# Patient Record
Sex: Male | Born: 1970 | Race: White | Hispanic: Yes | Marital: Married | Smoking: Former smoker
Health system: Southern US, Community
[De-identification: ages and names within clinical notes are randomized; demographics above are authoritative.]

## PROBLEM LIST (undated history)

## (undated) DIAGNOSIS — K769 Liver disease, unspecified: Secondary | ICD-10-CM

## (undated) DIAGNOSIS — K7460 Unspecified cirrhosis of liver: Secondary | ICD-10-CM

## (undated) HISTORY — DX: Unspecified cirrhosis of liver: K74.60

## (undated) HISTORY — DX: Liver disease, unspecified: K76.9

## (undated) HISTORY — PX: NO PAST SURGERIES: SHX2092

---

## 2010-02-04 ENCOUNTER — Emergency Department (HOSPITAL_COMMUNITY): Admission: EM | Admit: 2010-02-04 | Discharge: 2010-02-05 | Payer: Self-pay | Admitting: Emergency Medicine

## 2010-07-29 LAB — URINALYSIS, ROUTINE W REFLEX MICROSCOPIC
Bilirubin Urine: NEGATIVE
Glucose, UA: NEGATIVE mg/dL
Nitrite: NEGATIVE
Specific Gravity, Urine: 1.029 (ref 1.005–1.030)
pH: 6 (ref 5.0–8.0)

## 2010-07-29 LAB — URINE MICROSCOPIC-ADD ON

## 2012-10-15 ENCOUNTER — Emergency Department (HOSPITAL_COMMUNITY): Payer: Self-pay

## 2012-10-15 ENCOUNTER — Encounter (HOSPITAL_COMMUNITY): Payer: Self-pay | Admitting: *Deleted

## 2012-10-15 ENCOUNTER — Emergency Department (HOSPITAL_COMMUNITY)
Admission: EM | Admit: 2012-10-15 | Discharge: 2012-10-15 | Disposition: A | Discharge status: Home / Self Care | Payer: Self-pay | Attending: Emergency Medicine | Admitting: Emergency Medicine

## 2012-10-15 DIAGNOSIS — F17200 Nicotine dependence, unspecified, uncomplicated: Secondary | ICD-10-CM | POA: Insufficient documentation

## 2012-10-15 DIAGNOSIS — M79609 Pain in unspecified limb: Secondary | ICD-10-CM

## 2012-10-15 DIAGNOSIS — M79605 Pain in left leg: Secondary | ICD-10-CM

## 2012-10-15 MED ORDER — IBUPROFEN 600 MG PO TABS: 600.0000 mg | ORAL_TABLET | Freq: Three times a day (TID) | ORAL | Status: DC | PRN

## 2012-10-15 MED ORDER — OXYCODONE-ACETAMINOPHEN 5-325 MG PO TABS
1.0000 | ORAL_TABLET | Freq: Once | ORAL | Status: AC
  Administered 2012-10-15: 1 via ORAL
  Filled 2012-10-15: qty 1

## 2012-10-15 MED ORDER — HYDROCODONE-ACETAMINOPHEN 5-325 MG PO TABS: 1.0000 | ORAL_TABLET | ORAL | Status: DC | PRN

## 2012-10-15 NOTE — Progress Notes (Signed)
VASCULAR LAB PRELIMINARY  PRELIMINARY  PRELIMINARY  PRELIMINARY  Left lower extremity venous duplex completed.    Preliminary report:  Left:  No evidence of DVT, superficial thrombosis, or Baker's cyst.  Shanayah Kaffenberger, RVS 10/15/2012, 5:46 PM

## 2012-10-15 NOTE — ED Provider Notes (Signed)
History     CSN: [id number]  Arrival date & time 10/15/12  1629   First MD Initiated Contact with Patient 10/15/12 1844      Chief Complaint  Patient presents with  . Leg Pain     HPI Patient reports ongoing left lower leg pain over the past several weeks.  He reports his left medial distal shin hurts when he does squats and when he runs.  He denies numbness or weakness or tingling in his left foot.  No prior history of DVT.  No fevers or chills.  Her development of redness.  No recent fall or trauma or injury to the area.  His pain is mild to moderate in severity.  His pain is worse with ambulation and as above.  History reviewed. No pertinent past medical history.  History reviewed. No pertinent past surgical history.  No family history on file.  History  Substance Use Topics  . Smoking status: Current Every Day Smoker  . Smokeless tobacco: Not on file  . Alcohol Use: No      Review of Systems  All other systems reviewed and are negative.    Allergies  Review of patient's allergies indicates no known allergies.  Home Medications   Current Outpatient Rx  Name  Route  Sig  Dispense  Refill  . Ibuprofen (IBU PO)   Oral   Take 2 tablets by mouth every 6 (six) hours as needed (pain).         Marland Kitchen HYDROcodone-acetaminophen (NORCO/VICODIN) 5-325 MG per tablet   Oral   Take 1 tablet by mouth every 4 (four) hours as needed for pain.   15 tablet   0   . ibuprofen (ADVIL,MOTRIN) 600 MG tablet   Oral   Take 1 tablet (600 mg total) by mouth every 8 (eight) hours as needed for pain.   15 tablet   0     BP 120/73  Pulse 82  Temp(Src) 98.4 F (36.9 C) (Oral)  Resp 18  SpO2 100%  Physical Exam  Nursing note and vitals reviewed. Constitutional: He is oriented to person, place, and time. He appears well-developed and well-nourished.  HENT:  Head: Normocephalic.  Eyes: EOM are normal.  Neck: Normal range of motion.  Pulmonary/Chest: Effort normal.  Abdominal:  He exhibits no distension.  Musculoskeletal: Normal range of motion.  Mild tenderness of his left distal anteromedial tibia.  There is no obvious erythema warmth or fluctuance.  Normal PT and DP pulses in his left foot.  Full range of motion of his left ankle in his left knee.  No unilateral leg swelling of his left as compared to his right.  Full flexion and extension in his left foot without difficulty and with good strength.  Neurological: He is alert and oriented to person, place, and time.  Psychiatric: He has a normal mood and affect.    ED Course  Procedures (including critical care time)  Labs Reviewed - No data to display Dg Tibia/fibula Left  10/15/2012   *RADIOLOGY REPORT*  Clinical Data: Left tibia and fibula pain.  LEFT TIBIA AND FIBULA - 2 VIEW  Comparison: None.  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  The left tibia and fibula appear normal.  No fracture. Soft tissues appear normal.  IMPRESSION: No acute osseous abnormality.   Original Report Authenticated By: Dereck Ligas, M.D.   I personally reviewed the imaging tests through PACS system I reviewed available ER/hospitalization records through the EMR   1.  Leg pain, medial, left       MDM  Left medial lower extremity pain.  No signs of infection.  Normal pulses in his left foot.  Duplex negative for DVT.  Some of his baby more muscle strain in nature.        Hoy Morn, MD 10/15/12 [phone]

## 2012-10-15 NOTE — ED Notes (Signed)
Pt is here with complaint of left medial lower leg pain for about two weeks and hurts worse with ambulation.  Pulse present.  No injury

## 2018-01-29 ENCOUNTER — Encounter (HOSPITAL_COMMUNITY): Payer: Self-pay | Admitting: Emergency Medicine

## 2018-01-29 ENCOUNTER — Emergency Department (HOSPITAL_COMMUNITY): Payer: Self-pay

## 2018-01-29 ENCOUNTER — Emergency Department (HOSPITAL_COMMUNITY)
Admission: EM | Admit: 2018-01-29 | Discharge: 2018-01-30 | Disposition: A | Discharge status: Home / Self Care | Payer: Self-pay | Attending: Emergency Medicine | Admitting: Emergency Medicine

## 2018-01-29 ENCOUNTER — Other Ambulatory Visit: Payer: Self-pay

## 2018-01-29 DIAGNOSIS — R079 Chest pain, unspecified: Secondary | ICD-10-CM | POA: Insufficient documentation

## 2018-01-29 DIAGNOSIS — Z5321 Procedure and treatment not carried out due to patient leaving prior to being seen by health care provider: Secondary | ICD-10-CM | POA: Insufficient documentation

## 2018-01-29 LAB — CBC
HCT: 45.4 % (ref 39.0–52.0)
HEMOGLOBIN: 15.5 g/dL (ref 13.0–17.0)
MCH: 36.1 pg — ABNORMAL HIGH (ref 26.0–34.0)
MCHC: 34.1 g/dL (ref 30.0–36.0)
MCV: 105.8 fL — ABNORMAL HIGH (ref 78.0–100.0)
PLATELETS: 83 10*3/uL — AB (ref 150–400)
RBC: 4.29 MIL/uL (ref 4.22–5.81)
RDW: 13.4 % (ref 11.5–15.5)
WBC: 7.7 10*3/uL (ref 4.0–10.5)

## 2018-01-29 LAB — I-STAT TROPONIN, ED: TROPONIN I, POC: 0 ng/mL (ref 0.00–0.08)

## 2018-01-29 LAB — BASIC METABOLIC PANEL
ANION GAP: 10 (ref 5–15)
BUN: <5 mg/dL — ABNORMAL LOW (ref 6–20)
CALCIUM: 9.1 mg/dL (ref 8.9–10.3)
CO2: 25 mmol/L (ref 22–32)
CREATININE: 0.76 mg/dL (ref 0.61–1.24)
Chloride: 101 mmol/L (ref 98–111)
GLUCOSE: 130 mg/dL — AB (ref 70–99)
Potassium: 3.8 mmol/L (ref 3.5–5.1)
Sodium: 136 mmol/L (ref 135–145)

## 2018-01-29 NOTE — ED Triage Notes (Signed)
Pt to ER for evaluation of left chest pain radiating to left arm x2 weeks, patient states it worried him because he had pain radiating into his left jaw. Pt is former smoking, cessation 18 months ago, denies other medical hx. Reports pain is pressure. Pt reports shortness of breath and generalized weakness/fatigue.

## 2018-01-29 NOTE — ED Notes (Signed)
Pt. Stated they could not stay any longer.  Pt. Requested arm band cut off, so they could leave.

## 2019-11-24 ENCOUNTER — Emergency Department (HOSPITAL_COMMUNITY)
Admission: EM | Admit: 2019-11-24 | Discharge: 2019-11-24 | Disposition: A | Discharge status: Home / Self Care | Payer: Self-pay | Attending: Emergency Medicine | Admitting: Emergency Medicine

## 2019-11-24 ENCOUNTER — Other Ambulatory Visit: Payer: Self-pay

## 2019-11-24 ENCOUNTER — Emergency Department (HOSPITAL_COMMUNITY): Payer: Self-pay

## 2019-11-24 DIAGNOSIS — R079 Chest pain, unspecified: Secondary | ICD-10-CM | POA: Insufficient documentation

## 2019-11-24 DIAGNOSIS — M79602 Pain in left arm: Secondary | ICD-10-CM | POA: Insufficient documentation

## 2019-11-24 DIAGNOSIS — Z87891 Personal history of nicotine dependence: Secondary | ICD-10-CM | POA: Insufficient documentation

## 2019-11-24 LAB — CBC
HCT: 47.4 % (ref 39.0–52.0)
Hemoglobin: 16.5 g/dL (ref 13.0–17.0)
MCH: 34 pg (ref 26.0–34.0)
MCHC: 34.8 g/dL (ref 30.0–36.0)
MCV: 97.5 fL (ref 80.0–100.0)
Platelets: 105 10*3/uL — ABNORMAL LOW (ref 150–400)
RBC: 4.86 MIL/uL (ref 4.22–5.81)
RDW: 13.8 % (ref 11.5–15.5)
WBC: 4.6 10*3/uL (ref 4.0–10.5)
nRBC: 0 % (ref 0.0–0.2)

## 2019-11-24 LAB — BASIC METABOLIC PANEL
Anion gap: 12 (ref 5–15)
BUN: 7 mg/dL (ref 6–20)
CO2: 24 mmol/L (ref 22–32)
Calcium: 9.2 mg/dL (ref 8.9–10.3)
Chloride: 104 mmol/L (ref 98–111)
Creatinine, Ser: 0.77 mg/dL (ref 0.61–1.24)
GFR calc Af Amer: >60 mL/min (ref >60)
GFR calc non Af Amer: >60 mL/min (ref >60)
Glucose, Bld: 125 mg/dL — ABNORMAL HIGH (ref 70–99)
Potassium: 3.7 mmol/L (ref 3.5–5.1)
Sodium: 140 mmol/L (ref 135–145)

## 2019-11-24 LAB — TROPONIN I (HIGH SENSITIVITY)
Troponin I (High Sensitivity): 5 ng/L (ref <18)
Troponin I (High Sensitivity): 4 ng/L (ref <18)

## 2019-11-24 MED ORDER — SODIUM CHLORIDE 0.9% FLUSH: 3.0000 mL | Freq: Once | INTRAVENOUS | Status: DC

## 2019-11-24 NOTE — ED Triage Notes (Signed)
Patient accompanied by wife. Wife states patient has chest pain starting 2 days ago. 10/10. Wife states they have been working in hot temperatures and patient has not stayed hydrated. She also reports the pain is traveling down patients left arm.

## 2019-12-01 NOTE — ED Provider Notes (Signed)
[facility] DEPT Provider Note   CSN: [id number] Arrival date & time: 11/24/19  1027     History Chief Complaint  Patient presents with  . Chest Pain    Daniel Harmon is a 49 y.o. male.  HPI   49 year old male with chest pain.  Onset 2 days ago.  Pain is pretty constant.  Has not noticed any appreciable exacerbating leaving factors.  Sometimes some pain in his left arm.  No acute respiratory complaints.  No fevers or chills.  No unusual leg pain or swelling.  No past medical history on file.  There are no problems to display for this patient.  No past surgical history on file.    No family history on file.  Social History   Tobacco Use  . Smoking status: Former Smoker    Quit date: 07/29/2016    Years since quitting: 3.3  . Smokeless tobacco: Never Used  Substance Use Topics  . Alcohol use: No  . Drug use: No    Home Medications Prior to Admission medications   Medication Sig Start Date End Date Taking? Authorizing Provider  acetaminophen (TYLENOL) 500 MG tablet Take 500 mg by mouth every 6 (six) hours as needed for headache.   Yes [provider]  HYDROcodone-acetaminophen (NORCO/VICODIN) 5-325 MG per tablet Take 1 tablet by mouth every 4 (four) hours as needed for pain. Patient not taking: Reported on 11/24/2019 10/15/12   Jola Schmidt, MD  ibuprofen (ADVIL,MOTRIN) 600 MG tablet Take 1 tablet (600 mg total) by mouth every 8 (eight) hours as needed for pain. Patient not taking: Reported on 11/24/2019 10/15/12   Jola Schmidt, MD  Ibuprofen (IBU PO) Take 2 tablets by mouth every 6 (six) hours as needed (pain). Patient not taking: Reported on 11/24/2019    [provider]    Allergies    Patient has no known allergies.  Review of Systems   Review of Systems All systems reviewed and negative, other than as noted in HPI.  Physical Exam Updated Vital Signs BP (!) 144/82   Pulse 72   Temp 98.2 F (36.8 C) (Oral)    Resp 20   Ht 5' 6.14" (1.68 m)   Wt 99.8 kg   SpO2 92%   BMI 35.36 kg/m   Physical Exam Vitals and nursing note reviewed.  Constitutional:      General: He is not in acute distress.    Appearance: He is well-developed.  HENT:     Head: Normocephalic and atraumatic.  Eyes:     General:        Right eye: No discharge.        Left eye: No discharge.     Conjunctiva/sclera: Conjunctivae normal.  Cardiovascular:     Rate and Rhythm: Normal rate and regular rhythm.     Heart sounds: Normal heart sounds. No murmur heard.  No friction rub. No gallop.   Pulmonary:     Effort: Pulmonary effort is normal. No respiratory distress.     Breath sounds: Normal breath sounds.  Abdominal:     General: There is no distension.     Palpations: Abdomen is soft.     Tenderness: There is no abdominal tenderness.  Musculoskeletal:        General: No tenderness.     Cervical back: Neck supple.     Comments: Lower extremities symmetric as compared to each other. No calf tenderness. Negative Homan's. No palpable cords.   Skin:  General: Skin is warm and dry.  Neurological:     Mental Status: He is alert.  Psychiatric:        Behavior: Behavior normal.        Thought Content: Thought content normal.     ED Results / Procedures / Treatments   Labs (all labs ordered are listed, but only abnormal results are displayed) Labs Reviewed  BASIC METABOLIC PANEL - Abnormal; Notable for the following components:      Result Value   Glucose, Bld 125 (*)    All other components within normal limits  CBC - Abnormal; Notable for the following components:   Platelets 105 (*)    All other components within normal limits  TROPONIN I (HIGH SENSITIVITY)  TROPONIN I (HIGH SENSITIVITY)    EKG EKG Interpretation  Date/Time:  Sunday November 24 2019 11:37:56 EDT Ventricular Rate:  88 PR Interval:    QRS Duration: 114 QT Interval:  376 QTC Calculation: 455 R Axis:   61 Text Interpretation: Sinus rhythm  Incomplete right bundle branch block ST elev, probable normal early repol pattern No acute changes Confirmed by Mesner, Jason ([id number]) on 11/25/2019 11:44:52 AM   Radiology No results found.  Procedures Procedures (including critical care time)  Medications Ordered in ED Medications - No data to display  ED Course  I have reviewed the triage vital signs and the nursing notes.  Pertinent labs & imaging results that were available during my care of the patient were reviewed by me and considered in my medical decision making (see chart for details).    MDM Rules/Calculators/A&P                          49  year old male with chest pain.  Atypical for ACS.  Doubt PE, dissection or other emergent process. It has been determined that no acute conditions requiring further emergency intervention are present at this time. The patient has been advised of the diagnosis and plan. I reviewed any labs and imaging including any potential incidental findings. I have reviewed nursing notes and appropriate previous records. We have discussed signs and symptoms that warrant return to the ED and they are listed in the discharge instructions.      Final Clinical Impression(s) / ED Diagnoses Final diagnoses:  Chest pain, unspecified type    Rx / DC Orders ED Discharge Orders    None       Virgel Manifold, MD 12/01/19 1235

## 2020-06-24 DIAGNOSIS — R6881 Early satiety: Secondary | ICD-10-CM

## 2020-06-24 DIAGNOSIS — K7030 Alcoholic cirrhosis of liver without ascites: Secondary | ICD-10-CM

## 2020-06-24 DIAGNOSIS — F1020 Alcohol dependence, uncomplicated: Secondary | ICD-10-CM

## 2020-06-24 DIAGNOSIS — R630 Anorexia: Secondary | ICD-10-CM | POA: Insufficient documentation

## 2020-06-24 DIAGNOSIS — R160 Hepatomegaly, not elsewhere classified: Secondary | ICD-10-CM | POA: Insufficient documentation

## 2020-06-29 ENCOUNTER — Ambulatory Visit: Payer: Self-pay | Admitting: Physician Assistant

## 2021-12-19 IMAGING — CR DG CHEST 2V
2 series · 2 of 2 positions shown · non-contrast
Comparison: January 29, 2018

CLINICAL DATA: Chest pain

EXAM:
CHEST - 2 VIEW

[w chest pa]
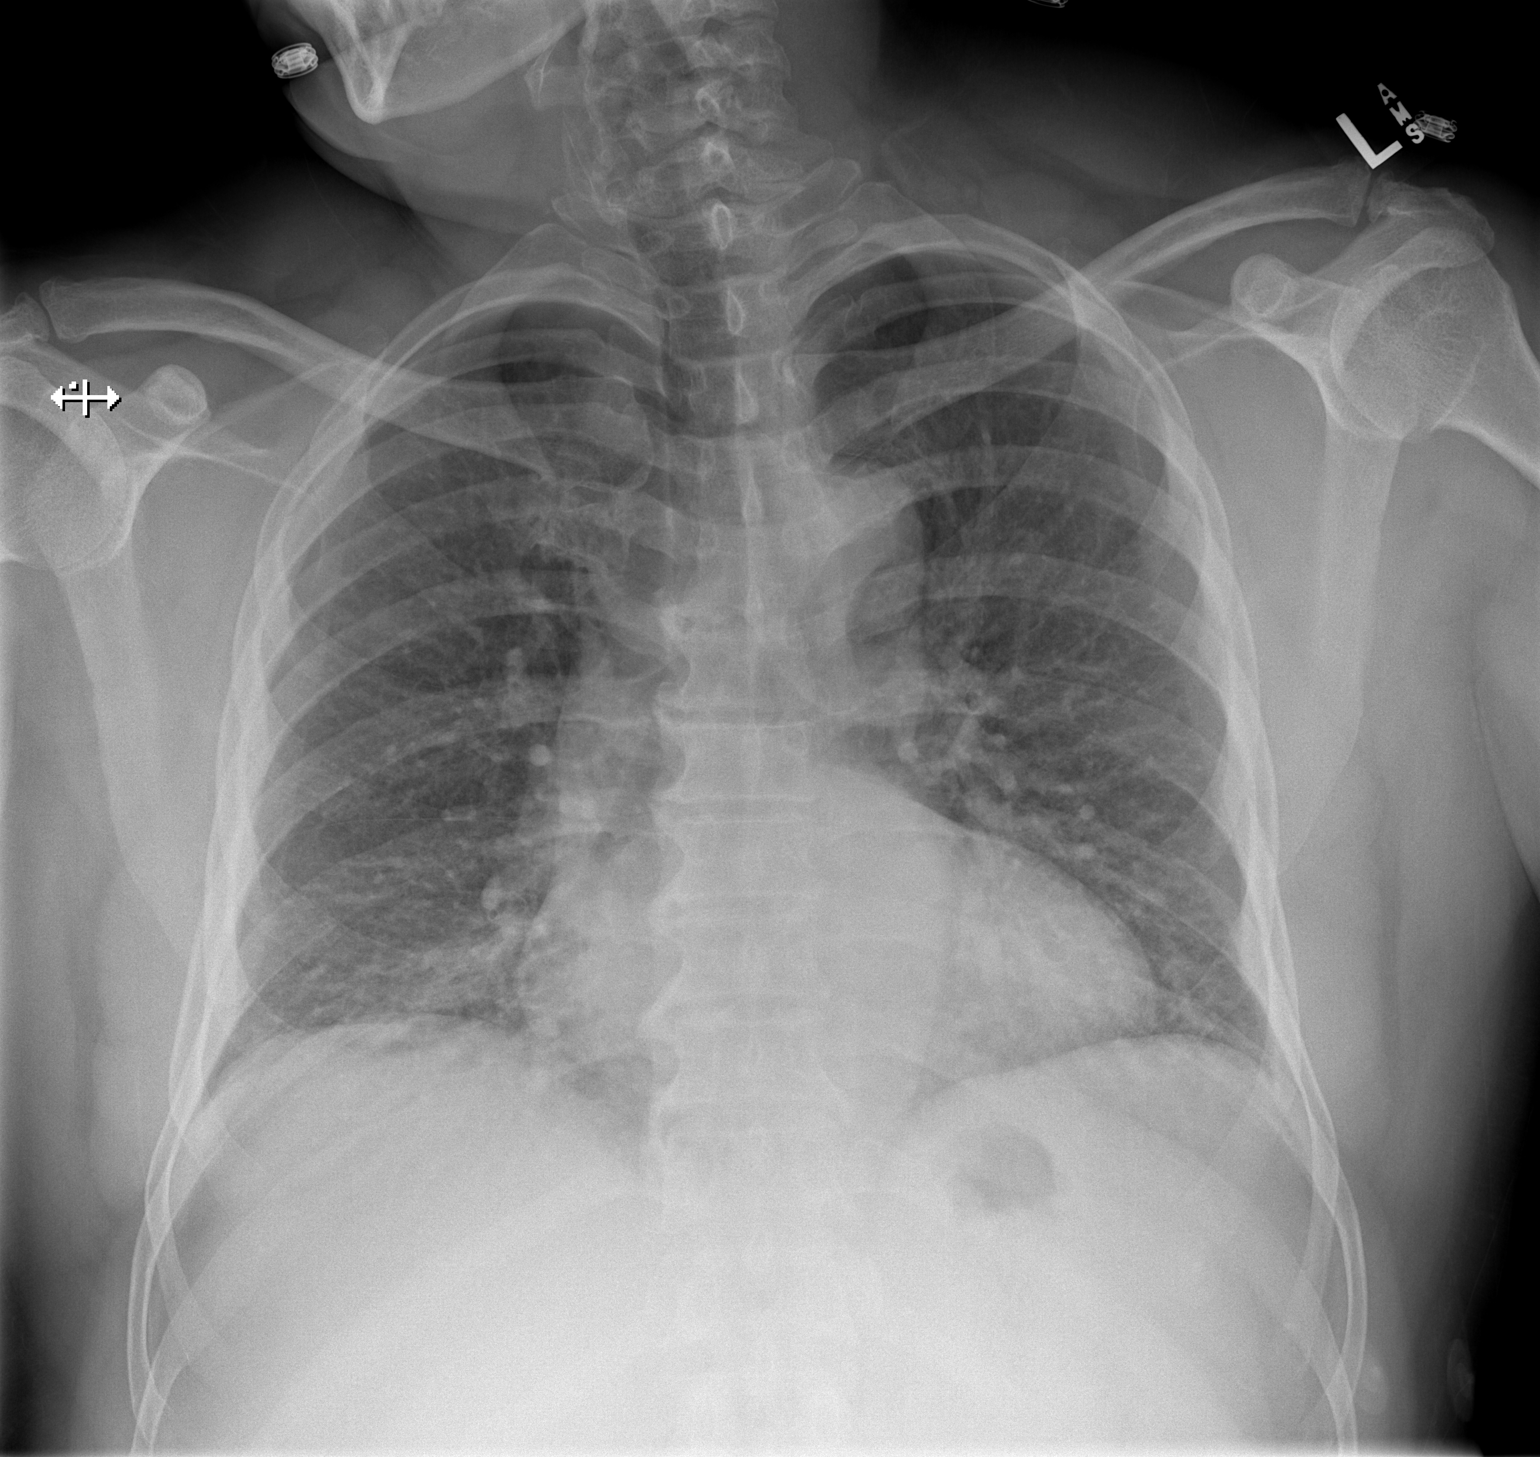

[w chest lat]
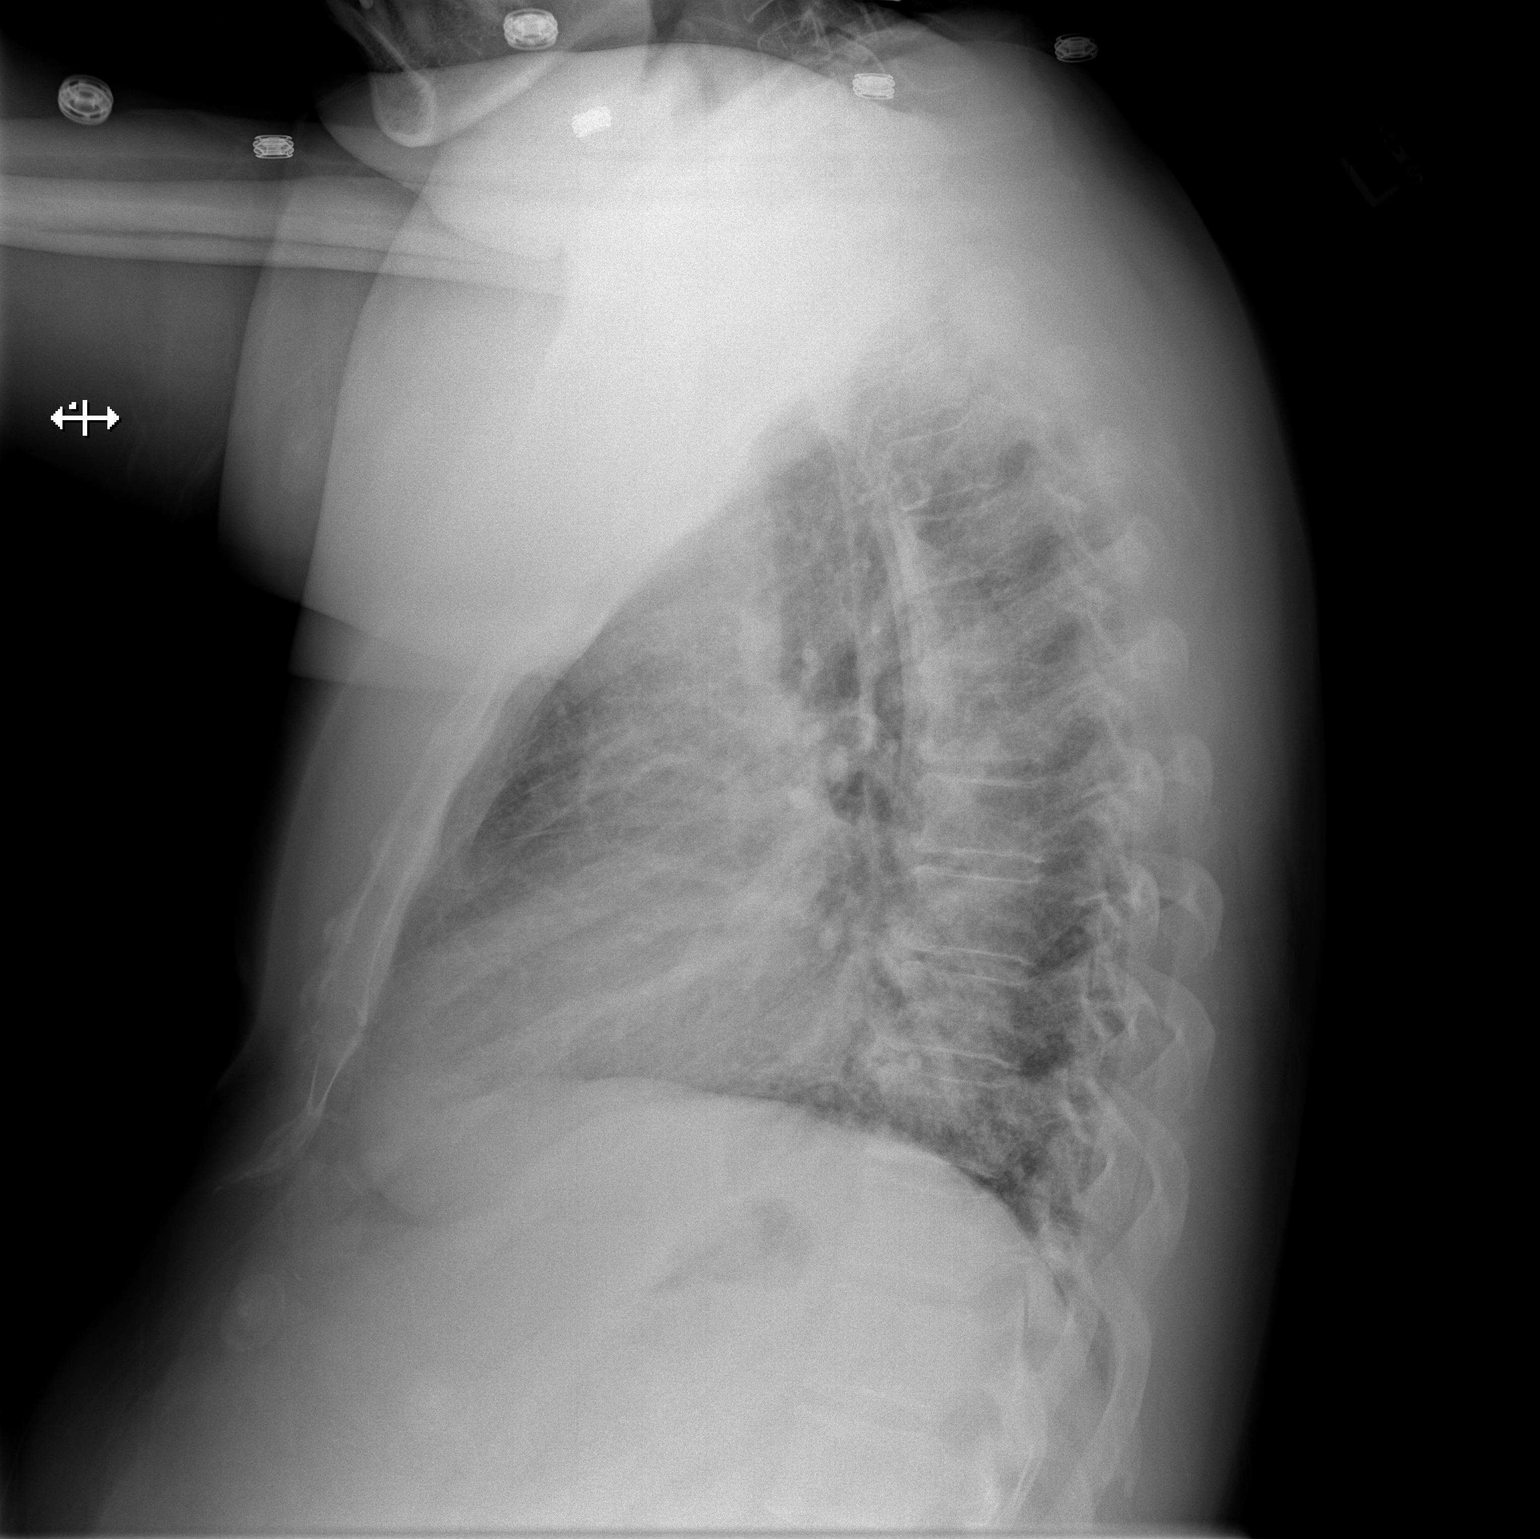

[2 of 2 positions shown; findings below may reference images not displayed]

FINDINGS: Lungs are clear. Heart is upper normal in size with pulmonary
vascularity normal. No adenopathy. There is degenerative change in
the thoracic spine.
IMPRESSION: Lungs clear.  Heart upper normal in size.  No adenopathy.

## 2022-02-27 ENCOUNTER — Emergency Department (HOSPITAL_COMMUNITY): Payer: Self-pay | Admitting: Certified Registered Nurse Anesthetist

## 2022-02-27 ENCOUNTER — Emergency Department (EMERGENCY_DEPARTMENT_HOSPITAL): Payer: Self-pay | Admitting: Certified Registered Nurse Anesthetist

## 2022-02-27 ENCOUNTER — Inpatient Hospital Stay (HOSPITAL_COMMUNITY)
Admission: EM | Admit: 2022-02-27 | Discharge: 2022-03-05 | DRG: 405 | Disposition: A | Discharge status: Home / Self Care | Payer: Self-pay | Attending: Internal Medicine | Admitting: Internal Medicine

## 2022-02-27 ENCOUNTER — Encounter (HOSPITAL_COMMUNITY)
Admission: EM | Disposition: A | Discharge status: Home / Self Care | Payer: Self-pay | Source: Home / Self Care | Attending: Internal Medicine

## 2022-02-27 ENCOUNTER — Inpatient Hospital Stay (HOSPITAL_COMMUNITY): Payer: Self-pay

## 2022-02-27 ENCOUNTER — Encounter (HOSPITAL_COMMUNITY): Payer: Self-pay

## 2022-02-27 ENCOUNTER — Emergency Department (HOSPITAL_COMMUNITY): Payer: Self-pay

## 2022-02-27 ENCOUNTER — Other Ambulatory Visit: Payer: Self-pay

## 2022-02-27 DIAGNOSIS — E162 Hypoglycemia, unspecified: Secondary | ICD-10-CM | POA: Diagnosis present

## 2022-02-27 DIAGNOSIS — D649 Anemia, unspecified: Secondary | ICD-10-CM

## 2022-02-27 DIAGNOSIS — K7581 Nonalcoholic steatohepatitis (NASH): Secondary | ICD-10-CM | POA: Diagnosis present

## 2022-02-27 DIAGNOSIS — E860 Dehydration: Secondary | ICD-10-CM | POA: Diagnosis present

## 2022-02-27 DIAGNOSIS — Z811 Family history of alcohol abuse and dependence: Secondary | ICD-10-CM

## 2022-02-27 DIAGNOSIS — I8511 Secondary esophageal varices with bleeding: Secondary | ICD-10-CM | POA: Diagnosis present

## 2022-02-27 DIAGNOSIS — Z6372 Alcoholism and drug addiction in family: Secondary | ICD-10-CM

## 2022-02-27 DIAGNOSIS — R578 Other shock: Secondary | ICD-10-CM | POA: Diagnosis present

## 2022-02-27 DIAGNOSIS — D696 Thrombocytopenia, unspecified: Secondary | ICD-10-CM | POA: Diagnosis present

## 2022-02-27 DIAGNOSIS — E872 Acidosis, unspecified: Secondary | ICD-10-CM | POA: Diagnosis present

## 2022-02-27 DIAGNOSIS — D62 Acute posthemorrhagic anemia: Secondary | ICD-10-CM | POA: Diagnosis present

## 2022-02-27 DIAGNOSIS — I8500 Esophageal varices without bleeding: Secondary | ICD-10-CM

## 2022-02-27 DIAGNOSIS — K7030 Alcoholic cirrhosis of liver without ascites: Principal | ICD-10-CM | POA: Diagnosis present

## 2022-02-27 DIAGNOSIS — K921 Melena: Secondary | ICD-10-CM

## 2022-02-27 DIAGNOSIS — I864 Gastric varices: Secondary | ICD-10-CM | POA: Diagnosis present

## 2022-02-27 DIAGNOSIS — R739 Hyperglycemia, unspecified: Secondary | ICD-10-CM | POA: Diagnosis present

## 2022-02-27 DIAGNOSIS — E669 Obesity, unspecified: Secondary | ICD-10-CM

## 2022-02-27 DIAGNOSIS — Z87891 Personal history of nicotine dependence: Secondary | ICD-10-CM

## 2022-02-27 DIAGNOSIS — K922 Gastrointestinal hemorrhage, unspecified: Principal | ICD-10-CM

## 2022-02-27 DIAGNOSIS — D72829 Elevated white blood cell count, unspecified: Secondary | ICD-10-CM | POA: Diagnosis present

## 2022-02-27 DIAGNOSIS — N179 Acute kidney failure, unspecified: Secondary | ICD-10-CM | POA: Diagnosis present

## 2022-02-27 DIAGNOSIS — K920 Hematemesis: Secondary | ICD-10-CM

## 2022-02-27 DIAGNOSIS — K766 Portal hypertension: Secondary | ICD-10-CM | POA: Diagnosis present

## 2022-02-27 HISTORY — PX: ESOPHAGEAL BANDING: SHX5518

## 2022-02-27 HISTORY — PX: ESOPHAGOGASTRODUODENOSCOPY: SHX5428

## 2022-02-27 LAB — CBC WITH DIFFERENTIAL/PLATELET
Abs Immature Granulocytes: 0.04 10*3/uL (ref 0.00–0.07)
Basophils Absolute: 0 10*3/uL (ref 0.0–0.1)
Basophils Relative: 0 %
Eosinophils Absolute: 0 10*3/uL (ref 0.0–0.5)
Eosinophils Relative: 0 %
HCT: 32.1 % — ABNORMAL LOW (ref 39.0–52.0)
Hemoglobin: 11.1 g/dL — ABNORMAL LOW (ref 13.0–17.0)
Immature Granulocytes: 0 %
Lymphocytes Relative: 16 %
Lymphs Abs: 1.8 10*3/uL (ref 0.7–4.0)
MCH: 33.5 pg (ref 26.0–34.0)
MCHC: 34.6 g/dL (ref 30.0–36.0)
MCV: 97 fL (ref 80.0–100.0)
Monocytes Absolute: 0.7 10*3/uL (ref 0.1–1.0)
Monocytes Relative: 6 %
Neutro Abs: 8.3 10*3/uL — ABNORMAL HIGH (ref 1.7–7.7)
Neutrophils Relative %: 78 %
Platelets: 138 10*3/uL — ABNORMAL LOW (ref 150–400)
RBC: 3.31 MIL/uL — ABNORMAL LOW (ref 4.22–5.81)
RDW: 13.1 % (ref 11.5–15.5)
WBC: 10.8 10*3/uL — ABNORMAL HIGH (ref 4.0–10.5)
nRBC: 0 % (ref 0.0–0.2)

## 2022-02-27 LAB — BASIC METABOLIC PANEL
Anion gap: 7 (ref 5–15)
BUN: 36 mg/dL — ABNORMAL HIGH (ref 6–20)
CO2: 20 mmol/L — ABNORMAL LOW (ref 22–32)
Calcium: 6.9 mg/dL — ABNORMAL LOW (ref 8.9–10.3)
Chloride: 115 mmol/L — ABNORMAL HIGH (ref 98–111)
Creatinine, Ser: 1.18 mg/dL (ref 0.61–1.24)
GFR, Estimated: >60 mL/min (ref >60)
Glucose, Bld: 193 mg/dL — ABNORMAL HIGH (ref 70–99)
Potassium: 4 mmol/L (ref 3.5–5.1)
Sodium: 142 mmol/L (ref 135–145)

## 2022-02-27 LAB — COMPREHENSIVE METABOLIC PANEL
ALT: 33 U/L (ref 0–44)
AST: 34 U/L (ref 15–41)
Albumin: 3.5 g/dL (ref 3.5–5.0)
Alkaline Phosphatase: 66 U/L (ref 38–126)
Anion gap: 7 (ref 5–15)
BUN: 45 mg/dL — ABNORMAL HIGH (ref 6–20)
CO2: 22 mmol/L (ref 22–32)
Calcium: 8.7 mg/dL — ABNORMAL LOW (ref 8.9–10.3)
Chloride: 112 mmol/L — ABNORMAL HIGH (ref 98–111)
Creatinine, Ser: 0.99 mg/dL (ref 0.61–1.24)
GFR, Estimated: >60 mL/min (ref >60)
Glucose, Bld: 171 mg/dL — ABNORMAL HIGH (ref 70–99)
Potassium: 4 mmol/L (ref 3.5–5.1)
Sodium: 141 mmol/L (ref 135–145)
Total Bilirubin: 1.6 mg/dL — ABNORMAL HIGH (ref 0.3–1.2)
Total Protein: 6.3 g/dL — ABNORMAL LOW (ref 6.5–8.1)

## 2022-02-27 LAB — HEPATITIS PANEL, ACUTE
HCV Ab: NONREACTIVE
Hep A IgM: NONREACTIVE
Hep B C IgM: NONREACTIVE
Hepatitis B Surface Ag: NONREACTIVE

## 2022-02-27 LAB — HEMOGLOBIN A1C
Hgb A1c MFr Bld: 5.1 % (ref 4.8–5.6)
Mean Plasma Glucose: 99.67 mg/dL

## 2022-02-27 LAB — PREPARE RBC (CROSSMATCH)

## 2022-02-27 LAB — GLUCOSE, CAPILLARY
Glucose-Capillary: 137 mg/dL — ABNORMAL HIGH (ref 70–99)
Glucose-Capillary: 150 mg/dL — ABNORMAL HIGH (ref 70–99)

## 2022-02-27 LAB — MRSA NEXT GEN BY PCR, NASAL: MRSA by PCR Next Gen: NOT DETECTED

## 2022-02-27 LAB — ABO/RH: ABO/RH(D): O POS

## 2022-02-27 LAB — HEMOGLOBIN AND HEMATOCRIT, BLOOD
HCT: 28.9 % — ABNORMAL LOW (ref 39.0–52.0)
HCT: 24 % — ABNORMAL LOW (ref 39.0–52.0)
Hemoglobin: 9.7 g/dL — ABNORMAL LOW (ref 13.0–17.0)
Hemoglobin: 8.3 g/dL — ABNORMAL LOW (ref 13.0–17.0)

## 2022-02-27 LAB — BILIRUBIN, FRACTIONATED(TOT/DIR/INDIR)
Bilirubin, Direct: 0.2 mg/dL (ref 0.0–0.2)
Indirect Bilirubin: 1 mg/dL — ABNORMAL HIGH (ref 0.3–0.9)
Total Bilirubin: 1.2 mg/dL (ref 0.3–1.2)

## 2022-02-27 LAB — PROTIME-INR
INR: 1.7 — ABNORMAL HIGH (ref 0.8–1.2)
INR: 1.7 — ABNORMAL HIGH (ref 0.8–1.2)
Prothrombin Time: 20 seconds — ABNORMAL HIGH (ref 11.4–15.2)
Prothrombin Time: 20.1 seconds — ABNORMAL HIGH (ref 11.4–15.2)

## 2022-02-27 LAB — APTT
aPTT: 30 seconds (ref 24–36)
aPTT: 24 seconds (ref 24–36)

## 2022-02-27 LAB — CBG MONITORING, ED: Glucose-Capillary: 159 mg/dL — ABNORMAL HIGH (ref 70–99)

## 2022-02-27 LAB — HIV ANTIBODY (ROUTINE TESTING W REFLEX): HIV Screen 4th Generation wRfx: NONREACTIVE

## 2022-02-27 SURGERY — EGD (ESOPHAGOGASTRODUODENOSCOPY)
Anesthesia: Monitor Anesthesia Care

## 2022-02-27 MED ORDER — PROPOFOL 500 MG/50ML IV EMUL
INTRAVENOUS | Status: DC | PRN
  Administered 2022-02-27: 150 ug/kg/min via INTRAVENOUS

## 2022-02-27 MED ORDER — PHENYLEPHRINE HCL (PRESSORS) 10 MG/ML IV SOLN
INTRAVENOUS | Status: DC | PRN
  Administered 2022-02-27: 160 ug via INTRAVENOUS
  Administered 2022-02-27 (×2): 240 ug via INTRAVENOUS
  Administered 2022-02-27: 160 ug via INTRAVENOUS

## 2022-02-27 MED ORDER — EPHEDRINE SULFATE (PRESSORS) 50 MG/ML IJ SOLN
INTRAMUSCULAR | Status: DC | PRN
  Administered 2022-02-27: 10 mg via INTRAVENOUS

## 2022-02-27 MED ORDER — VITAMIN K1 10 MG/ML IJ SOLN
10.0000 mg | Freq: Once | INTRAVENOUS | Status: AC
  Administered 2022-02-27: 10 mg via INTRAVENOUS
  Filled 2022-02-27: qty 1

## 2022-02-27 MED ORDER — VASOPRESSIN 20 UNITS/100 ML INFUSION FOR SHOCK
0.0000 [IU]/min | INTRAVENOUS | Status: DC
  Administered 2022-02-27 – 2022-02-28 (×2): 0.03 [IU]/min via INTRAVENOUS
  Filled 2022-02-27 (×2): qty 100

## 2022-02-27 MED ORDER — SODIUM CHLORIDE 0.9 % IV BOLUS
1000.0000 mL | Freq: Once | INTRAVENOUS | Status: AC
  Administered 2022-02-27: 1000 mL via INTRAVENOUS

## 2022-02-27 MED ORDER — PANTOPRAZOLE SODIUM 40 MG IV SOLR
40.0000 mg | Freq: Once | INTRAVENOUS | Status: AC
  Administered 2022-02-27: 40 mg via INTRAVENOUS

## 2022-02-27 MED ORDER — PANTOPRAZOLE SODIUM 40 MG IV SOLR: 40.0000 mg | Freq: Two times a day (BID) | INTRAVENOUS | Status: DC

## 2022-02-27 MED ORDER — LACTATED RINGERS IV BOLUS
1000.0000 mL | Freq: Once | INTRAVENOUS | Status: AC
  Administered 2022-02-27: 1000 mL via INTRAVENOUS

## 2022-02-27 MED ORDER — IOHEXOL 350 MG/ML SOLN
100.0000 mL | Freq: Once | INTRAVENOUS | Status: AC | PRN
  Administered 2022-02-27: 100 mL via INTRAVENOUS

## 2022-02-27 MED ORDER — ORAL CARE MOUTH RINSE: 15.0000 mL | OROMUCOSAL | Status: DC | PRN

## 2022-02-27 MED ORDER — INSULIN ASPART 100 UNIT/ML IJ SOLN
0.0000 [IU] | INTRAMUSCULAR | Status: DC
  Administered 2022-02-27: 3 [IU] via SUBCUTANEOUS
  Administered 2022-02-28 (×2): 2 [IU] via SUBCUTANEOUS
  Administered 2022-02-28: 3 [IU] via SUBCUTANEOUS
  Administered 2022-03-02: 2 [IU] via SUBCUTANEOUS
  Administered 2022-03-03: 3 [IU] via SUBCUTANEOUS
  Administered 2022-03-03 (×3): 2 [IU] via SUBCUTANEOUS

## 2022-02-27 MED ORDER — LACTATED RINGERS IV SOLN: INTRAVENOUS | Status: DC | PRN

## 2022-02-27 MED ORDER — SODIUM CHLORIDE 0.9 % IV SOLN
2.0000 g | Freq: Once | INTRAVENOUS | Status: AC
  Administered 2022-02-27: 2 g via INTRAVENOUS
  Filled 2022-02-27: qty 20

## 2022-02-27 MED ORDER — LACTATED RINGERS IV SOLN: INTRAVENOUS | Status: DC

## 2022-02-27 MED ORDER — PANTOPRAZOLE SODIUM 40 MG IV SOLR
40.0000 mg | Freq: Once | INTRAVENOUS | Status: AC
  Administered 2022-02-27: 40 mg via INTRAVENOUS
  Filled 2022-02-27: qty 10

## 2022-02-27 MED ORDER — NOREPINEPHRINE 4 MG/250ML-% IV SOLN
2.0000 ug/min | INTRAVENOUS | Status: DC
  Administered 2022-02-27: 13 ug/min via INTRAVENOUS
  Administered 2022-02-27: 20 ug/min via INTRAVENOUS
  Filled 2022-02-27: qty 250

## 2022-02-27 MED ORDER — OCTREOTIDE LOAD VIA INFUSION
50.0000 ug | Freq: Once | INTRAVENOUS | Status: AC
  Administered 2022-02-27: 50 ug via INTRAVENOUS
  Filled 2022-02-27: qty 25

## 2022-02-27 MED ORDER — ONDANSETRON HCL 4 MG/2ML IJ SOLN
4.0000 mg | Freq: Once | INTRAMUSCULAR | Status: AC
  Administered 2022-02-27: 4 mg via INTRAVENOUS

## 2022-02-27 MED ORDER — PHENYLEPHRINE 80 MCG/ML (10ML) SYRINGE FOR IV PUSH (FOR BLOOD PRESSURE SUPPORT)
PREFILLED_SYRINGE | INTRAVENOUS | Status: AC
  Filled 2022-02-27: qty 20

## 2022-02-27 MED ORDER — VASOPRESSIN 20 UNIT/ML IV SOLN
INTRAVENOUS | Status: AC
  Filled 2022-02-27: qty 1

## 2022-02-27 MED ORDER — VASOPRESSIN 20 UNIT/ML IV SOLN
INTRAVENOUS | Status: DC | PRN
  Administered 2022-02-27: 1 [IU] via INTRAVENOUS

## 2022-02-27 MED ORDER — SODIUM CHLORIDE 0.9 % IV SOLN
250.0000 mL | INTRAVENOUS | Status: DC
  Administered 2022-02-27 – 2022-03-02 (×2): 250 mL via INTRAVENOUS

## 2022-02-27 MED ORDER — SODIUM CHLORIDE 0.9 % IV SOLN: 10.0000 mL/h | Freq: Once | INTRAVENOUS | Status: DC

## 2022-02-27 MED ORDER — NOREPINEPHRINE 4 MG/250ML-% IV SOLN
0.0000 ug/min | INTRAVENOUS | Status: DC
  Administered 2022-02-27: 20 ug/min via INTRAVENOUS

## 2022-02-27 MED ORDER — SODIUM CHLORIDE 0.9 % IV SOLN
50.0000 ug/h | INTRAVENOUS | Status: DC
  Administered 2022-02-27 – 2022-03-03 (×9): 50 ug/h via INTRAVENOUS
  Filled 2022-02-27 (×12): qty 1

## 2022-02-27 MED ORDER — PANTOPRAZOLE INFUSION (NEW) - SIMPLE MED
8.0000 mg/h | INTRAVENOUS | Status: DC
  Administered 2022-02-27 – 2022-02-28 (×4): 8 mg/h via INTRAVENOUS
  Filled 2022-02-27 (×3): qty 100
  Filled 2022-02-27 (×3): qty 80

## 2022-02-27 MED ORDER — SODIUM CHLORIDE 0.9% IV SOLUTION: Freq: Once | INTRAVENOUS | Status: AC

## 2022-02-27 MED ORDER — CHLORHEXIDINE GLUCONATE CLOTH 2 % EX PADS
6.0000 | MEDICATED_PAD | Freq: Every day | CUTANEOUS | Status: DC
  Administered 2022-02-27 – 2022-03-02 (×4): 6 via TOPICAL

## 2022-02-27 MED ORDER — PROCHLORPERAZINE EDISYLATE 10 MG/2ML IJ SOLN
10.0000 mg | Freq: Four times a day (QID) | INTRAMUSCULAR | Status: DC | PRN
  Administered 2022-02-27: 10 mg via INTRAVENOUS
  Filled 2022-02-27: qty 2

## 2022-02-27 MED ORDER — FENTANYL CITRATE PF 50 MCG/ML IJ SOSY: 100.0000 ug | PREFILLED_SYRINGE | Freq: Once | INTRAMUSCULAR | Status: DC

## 2022-02-27 MED ORDER — SODIUM CHLORIDE 0.9 % IV BOLUS
1000.0000 mL | Freq: Once | INTRAVENOUS | Status: AC | PRN
  Administered 2022-02-27: 1000 mL via INTRAVENOUS

## 2022-02-27 MED ORDER — ORAL CARE MOUTH RINSE
15.0000 mL | OROMUCOSAL | Status: DC
  Administered 2022-02-27: 15 mL via OROMUCOSAL

## 2022-02-27 MED ORDER — ONDANSETRON HCL 4 MG/2ML IJ SOLN
INTRAMUSCULAR | Status: AC
  Filled 2022-02-27: qty 2

## 2022-02-27 NOTE — Consult Note (Signed)
[facility] Gastroenterology Consult  Referring Provider: No ref. provider found Primary Care Physician:  Pcp, No Primary Gastroenterologist: none  Reason for Consultation: hematemesis  SUBJECTIVE:   HPI: Daniel Harmon is a 51 y.o. male with past medical history of unspecified liver disease. He presents from home with his wife for acute onset of hematemesis. HPI largely obtained by patient's spouse at bedside via virtual interpreter service given patient altered mentation during exam.   Patient ate some bison yesterday. Later that evening, roughly 1800, was having frothy bloody emesis. He began to have frank hematemesis with clots ~0200/0300 which prompted his presentation to the hospital. Wife notes he had melena yesterday as well. Etiology of prior liver disease not known, patient stopped all alcohol use 2 years prior. No anticoagulant use. NSAID use with Ibuprofen, last use last night. Having some shortness of breath and epigastric abdominal discomfort. No fevers or chills. No chest pain.  No prior EGD nor colonoscopy. He does not follow with a gastroenterologist in the outpatient setting.  Past Medical History:  Diagnosis Date   Cirrhosis ([place])    Past Surgical History:  Procedure Laterality Date   NO PAST SURGERIES     Prior to Admission medications   Medication Sig Start Date End Date Taking? Authorizing Provider  acetaminophen (TYLENOL) 500 MG tablet Take 500 mg by mouth every 6 (six) hours as needed for headache.    [provider]  HYDROcodone-acetaminophen (NORCO/VICODIN) 5-325 MG per tablet Take 1 tablet by mouth every 4 (four) hours as needed for pain. Patient not taking: Reported on 11/24/2019 10/15/12   Jola Schmidt, MD  ibuprofen (ADVIL,MOTRIN) 600 MG tablet Take 1 tablet (600 mg total) by mouth every 8 (eight) hours as needed for pain. Patient not taking: Reported on 11/24/2019 10/15/12   Jola Schmidt, MD  Ibuprofen (IBU PO) Take 2 tablets by mouth every 6 (six) hours as  needed (pain). Patient not taking: Reported on 11/24/2019    [provider]   Current Facility-Administered Medications  Medication Dose Route Frequency Provider Last Rate Last Admin   0.9 %  sodium chloride infusion  10 mL/hr Intravenous Once Molpus, John, MD       cefTRIAXone (ROCEPHIN) 2 g in sodium chloride 0.9 % 100 mL IVPB  2 g Intravenous Once Molpus, John, MD 200 mL/hr at 02/27/22 0530 2 g at 02/27/22 0530   octreotide (SANDOSTATIN) 2 mcg/mL load via infusion 50 mcg  50 mcg Intravenous Once Molpus, John, MD       And   octreotide (SANDOSTATIN) 500 mcg in sodium chloride 0.9 % 250 mL (2 mcg/mL) infusion  50 mcg/hr Intravenous Continuous Molpus, John, MD       Derrill Memo ON 03/02/2022] pantoprazole (PROTONIX) injection 40 mg  40 mg Intravenous Q12H Molpus, John, MD       pantoprozole (PROTONIX) 80 mg /NS 100 mL infusion  8 mg/hr Intravenous Continuous Molpus, John, MD       Current Outpatient Medications  Medication Sig Dispense Refill   acetaminophen (TYLENOL) 500 MG tablet Take 500 mg by mouth every 6 (six) hours as needed for headache.     HYDROcodone-acetaminophen (NORCO/VICODIN) 5-325 MG per tablet Take 1 tablet by mouth every 4 (four) hours as needed for pain. (Patient not taking: Reported on 11/24/2019) 15 tablet 0   ibuprofen (ADVIL,MOTRIN) 600 MG tablet Take 1 tablet (600 mg total) by mouth every 8 (eight) hours as needed for pain. (Patient not taking: Reported on 11/24/2019) 15 tablet 0  Ibuprofen (IBU PO) Take 2 tablets by mouth every 6 (six) hours as needed (pain). (Patient not taking: Reported on 11/24/2019)     Allergies as of 02/27/2022   (No Known Allergies)   Family History  Problem Relation Age of Onset   Alcoholism Father    Social History   Socioeconomic History   Marital status: Married    Spouse name: Not on file   Number of children: Not on file   Years of education: Not on file   Highest education level: Not on file  Occupational History   Not on  file  Tobacco Use   Smoking status: Former    Types: Cigarettes    Quit date: 07/29/2016    Years since quitting: 5.5   Smokeless tobacco: Never  Substance and Sexual Activity   Alcohol use: Yes    Alcohol/week: 14.0 standard drinks of alcohol    Types: 14 Cans of beer per week   Drug use: No   Sexual activity: Not on file  Other Topics Concern   Not on file  Social History Narrative   Not on file   Social Determinants of Health   Financial Resource Strain: Not on file  Food Insecurity: Not on file  Transportation Needs: Not on file  Physical Activity: Not on file  Stress: Not on file  Social Connections: Not on file  Intimate Partner Violence: Not on file   Review of Systems:  Review of Systems  Constitutional:  Negative for chills and fever.  Respiratory:  Positive for shortness of breath.   Cardiovascular:  Negative for chest pain.  Gastrointestinal:  Positive for abdominal pain, nausea and vomiting.       Hematemesis.    OBJECTIVE:   Temp:  [98.6 F (37 C)] 98.6 F (37 C) (10/15 0550) Pulse Rate:  [86-96] 91 (10/15 0545) Resp:  [20-28] 21 (10/15 0545) BP: (77-133)/(52-62) 99/54 (10/15 0545) SpO2:  [96 %-100 %] 97 % (10/15 0545)   Physical Exam Constitutional:      General: He is in acute distress (active hematemesis during my encounter).     Appearance: He is well-developed and normal weight. He is ill-appearing.  Cardiovascular:     Rate and Rhythm: Normal rate and regular rhythm.  Pulmonary:     Effort: No respiratory distress.     Breath sounds: Normal breath sounds.     Comments: No supplemental oxygen in place at time of exam. Abdominal:     General: Bowel sounds are normal. There is no distension.     Palpations: Abdomen is soft.     Tenderness: There is abdominal tenderness (Epigastric). There is no guarding.  Neurological:     Mental Status: He is easily aroused. He is lethargic.     Labs: Recent Labs    02/27/22 0447  WBC 10.8*  HGB  11.1*  HCT 32.1*  PLT 138*   BMET Recent Labs    02/27/22 0445  NA 141  K 4.0  CL 112*  CO2 22  GLUCOSE 171*  BUN 45*  CREATININE 0.99  CALCIUM 8.7*   LFT Recent Labs    02/27/22 0445  PROT 6.3*  ALBUMIN 3.5  AST 34  ALT 33  ALKPHOS 66  BILITOT 1.6*   PT/INR No results for input(s): "LABPROT", "INR" in the last 72 hours.  Diagnostic imaging: No results found.  IMPRESSION: Hematemesis Normocytic, acute blood loss anemia Thrombocytopenia Hyperbilirubinemia Leukocytosis, mild NSAID use  PLAN: -Large bore IV x 2 -IV  PPI Q12Hr -Octreotide infusion 50 mcg/hr -Rocephin 2 gm IV Q24Hr -Maintain NPO -Ok for 1 unit PRBC given active hematemesis on my encounter -Discussed recommendation for EGD with patient and his spouse at bedside, patient not able to consent for himself given altered mentation, informed consent for EGD with possible endoscopic variceal band ligation obtained from patient's spouse via virtual interpretor -Plan for urgent EGD today   LOS: 0 days   Danton Clap, [facility]

## 2022-02-27 NOTE — ED Triage Notes (Signed)
Pt states that he woke up tonight and began to vomit large blood clots, hx of cirrhosis, some SOB, pt is a little jaundice

## 2022-02-27 NOTE — Progress Notes (Addendum)
eLink Physician-Brief Progress Note Patient Name: Daniel Harmon DOB: 1970-08-03 MRN: [id number]   Date of Service  02/27/2022  HPI/Events of Note  Spoke with Dr. Serafina Royals after his review of CTA.  His recommendation is to transfer to [facility].  He does not feel the urgency to have a procedure done tonight but patient will need it during this admission.   eICU Interventions  Transfer to [facility].     Intervention Category Intermediate Interventions: Other:  Elsie Lincoln 02/27/2022, 9:00 PM  9:44 PM Spoke with Dr. Serafina Royals who feels that it is in the patient's best interest to transfer to [facility] in case the patient decompensates tonight.   Dr. Chase Caller updated on the plan.

## 2022-02-27 NOTE — Progress Notes (Signed)
eLink Physician-Brief Progress Note Patient Name: Keithon Mccoin DOB: 25-Jul-1970 MRN: [id number]   Date of Service  02/27/2022  HPI/Events of Note  51/M with hx of cirrhosis presenting with hematemesis.  He had EGD done by Dr. Randel Pigg of [facility].  Pt with known varices.  Endoscopy showing blood but no active bleeding.  S/p esophageal variceal band ligation.  Pt on PPI and octreotide and transferred to the floors.  Pt had melea and syncopal episode.    Pt transferred to the ICU and started on levophed.  CTA abdomen done.   H&H 8.3/24  BP 121/89, HR 84, RR 22, O2 sats 99%.  eICU Interventions  Continue pressors through peripheral line.  Awaiting callback from IR regarding plans.  Continue ocrteotide and protonix gtts.  Continue ceftriaxone.  SCDs for DVT prophylaxis.       Intervention Category Evaluation Type: New Patient Evaluation  Elsie Lincoln 02/27/2022, 8:25 PM

## 2022-02-27 NOTE — Anesthesia Postprocedure Evaluation (Signed)
Anesthesia Post Note  Patient: Daniel Harmon  Procedure(s) Performed: ESOPHAGOGASTRODUODENOSCOPY (EGD) ESOPHAGEAL BANDING     Patient location during evaluation: PACU Anesthesia Type: MAC Level of consciousness: awake, responds to stimulation, lethargic and patient cooperative Pain management: pain level controlled Vital Signs Assessment: post-procedure vital signs reviewed and stable Respiratory status: spontaneous breathing, nonlabored ventilation and respiratory function stable Cardiovascular status: stable and blood pressure returned to baseline Postop Assessment: no apparent nausea or vomiting Anesthetic complications: no   No notable events documented.  Last Vitals:  Vitals:   02/27/22 0925 02/27/22 0930  BP: (!) 104/50 117/64  Pulse: 86 87  Resp: (!) 21 (!) 22  Temp:    SpO2: 98% 99%    Last Pain:  Vitals:   02/27/22 0930  TempSrc:   PainSc: 0-No pain                 Payton Prinsen A.

## 2022-02-27 NOTE — H&P (Signed)
History and Physical    Patient: Daniel Harmon DOB: 09-05-1970 DOA: 02/27/2022 DOS: the patient was seen and examined on 02/27/2022 PCP: Pcp, No  Patient coming from: Home  Chief Complaint:  Chief Complaint  Patient presents with   Hematemesis   HPI: Daniel Harmon is a 51 y.o. male with medical history significant of cirrhosis. Presenting w/ hematemesis. He was in his normal state of health until last night. He had sudden onset vomiting w/ copious amounts of blood. He did not have any hematuria or hematochezia/melena. He denies any abdominal pain. He denies recent EtOH use. He reports NSAID use yesterday. He became concerned about his symptoms and came to the hospital for evaluation. He denies any other aggravating or alleviating factors.    Review of Systems: As mentioned in the history of present illness. All other systems reviewed and are negative. Past Medical History:  Diagnosis Date   Cirrhosis [facility])    Past Surgical History:  Procedure Laterality Date   NO PAST SURGERIES     Social History:  reports that he quit smoking about 5 years ago. His smoking use included cigarettes. He has never used smokeless tobacco. He reports current alcohol use of about 14.0 standard drinks of alcohol per week. He reports that he does not use drugs.  No Known Allergies  Family History  Problem Relation Age of Onset   Alcoholism Father     Prior to Admission medications   Medication Sig Start Date End Date Taking? Authorizing Provider  acetaminophen (TYLENOL) 500 MG tablet Take 500 mg by mouth every 6 (six) hours as needed for headache.    [provider]  HYDROcodone-acetaminophen (NORCO/VICODIN) 5-325 MG per tablet Take 1 tablet by mouth every 4 (four) hours as needed for pain. Patient not taking: Reported on 11/24/2019 10/15/12   Jola Schmidt, MD  ibuprofen (ADVIL,MOTRIN) 600 MG tablet Take 1 tablet (600 mg total) by mouth every 8 (eight) hours as needed for  pain. Patient not taking: Reported on 11/24/2019 10/15/12   Jola Schmidt, MD  Ibuprofen (IBU PO) Take 2 tablets by mouth every 6 (six) hours as needed (pain). Patient not taking: Reported on 11/24/2019    [provider]    Physical Exam: Vitals:   02/27/22 0930 02/27/22 0950 02/27/22 1004 02/27/22 1020  BP: 117/64 109/66  113/64  Pulse: 87 87  92  Resp: (!) 22 18  20   Temp:   99 F (37.2 C)   TempSrc:   Oral   SpO2: 99% 98%  98%   General: 51 y.o. male resting in bed in NAD Eyes: PERRL, normal sclera ENMT: Nares patent w/o discharge, orophaynx clear, dentition normal, ears w/o discharge/lesions/ulcers Neck: Supple, trachea midline Cardiovascular: RRR, +S1, S2, no m/g/r, equal pulses throughout Respiratory: CTABL, no w/r/r, normal WOB GI: BS+, NDNT, no masses noted, no organomegaly noted MSK: No e/c/c Neuro: A&O x 3, no focal deficits Psyc: Appropriate interaction and affect, calm/cooperative  Data Reviewed:  Results for orders placed or performed during the hospital encounter of 02/27/22 (from the past 24 hour(s))  Comprehensive metabolic panel     Status: Abnormal   Collection Time: 02/27/22  4:45 AM  Result Value Ref Range   Sodium 141 135 - 145 mmol/L   Potassium 4.0 3.5 - 5.1 mmol/L   Chloride 112 (H) 98 - 111 mmol/L   CO2 22 22 - 32 mmol/L   Glucose, Bld 171 (H) 70 - 99 mg/dL   BUN 45 (H) 6 -  20 mg/dL   Creatinine, Ser 0.99 0.61 - 1.24 mg/dL   Calcium 8.7 (L) 8.9 - 10.3 mg/dL   Total Protein 6.3 (L) 6.5 - 8.1 g/dL   Albumin 3.5 3.5 - 5.0 g/dL   AST 34 15 - 41 U/L   ALT 33 0 - 44 U/L   Alkaline Phosphatase 66 38 - 126 U/L   Total Bilirubin 1.6 (H) 0.3 - 1.2 mg/dL   GFR, Estimated >60 >60 mL/min   Anion gap 7 5 - 15  Type and screen [place]     Status: None (Preliminary result)   Collection Time: 02/27/22  4:45 AM  Result Value Ref Range   ABO/RH(D) O POS    Antibody Screen NEG    Sample Expiration 03/02/2022,2359    Unit Number  [id number]    Blood Component Type RED CELLS,LR    Unit division 00    Status of Unit ALLOCATED    Unit tag comment VERBAL ORDERS PER DR MOLPUS,JOHN    Transfusion Status OK TO TRANSFUSE    Crossmatch Result COMPATIBLE    Unit Number [id number]    Blood Component Type RED CELLS,LR    Unit division 00    Status of Unit ALLOCATED    Unit tag comment VERBAL ORDERS PER DR MOLPUS,JOHN    Transfusion Status OK TO TRANSFUSE    Crossmatch Result COMPATIBLE   CBC with Differential     Status: Abnormal   Collection Time: 02/27/22  4:47 AM  Result Value Ref Range   WBC 10.8 (H) 4.0 - 10.5 K/uL   RBC 3.31 (L) 4.22 - 5.81 MIL/uL   Hemoglobin 11.1 (L) 13.0 - 17.0 g/dL   HCT 32.1 (L) 39.0 - 52.0 %   MCV 97.0 80.0 - 100.0 fL   MCH 33.5 26.0 - 34.0 pg   MCHC 34.6 30.0 - 36.0 g/dL   RDW 13.1 11.5 - 15.5 %   Platelets 138 (L) 150 - 400 K/uL   nRBC 0.0 0.0 - 0.2 %   Neutrophils Relative % 78 %   Neutro Abs 8.3 (H) 1.7 - 7.7 K/uL   Lymphocytes Relative 16 %   Lymphs Abs 1.8 0.7 - 4.0 K/uL   Monocytes Relative 6 %   Monocytes Absolute 0.7 0.1 - 1.0 K/uL   Eosinophils Relative 0 %   Eosinophils Absolute 0.0 0.0 - 0.5 K/uL   Basophils Relative 0 %   Basophils Absolute 0.0 0.0 - 0.1 K/uL   Immature Granulocytes 0 %   Abs Immature Granulocytes 0.04 0.00 - 0.07 K/uL  CBG monitoring, ED     Status: Abnormal   Collection Time: 02/27/22  5:02 AM  Result Value Ref Range   Glucose-Capillary 159 (H) 70 - 99 mg/dL  Prepare RBC (crossmatch)     Status: None   Collection Time: 02/27/22  5:03 AM  Result Value Ref Range   Order Confirmation      ORDER PROCESSED BY BLOOD BANK Performed at [facility], [place] [address] [place], [place] [address]   ABO/Rh     Status: None   Collection Time: 02/27/22  5:45 AM  Result Value Ref Range   ABO/RH(D)      O POS Performed at [facility], [place] [address], [place], [place] [address]   APTT     Status: None    Collection Time: 02/27/22  7:57 AM  Result Value Ref Range   aPTT 30 24 - 36 seconds  Protime-INR     Status: Abnormal   Collection Time: 02/27/22  7:57 AM  Result Value Ref Range   Prothrombin Time 20.0 (H) 11.4 - 15.2 seconds   INR 1.7 (H) 0.8 - 1.2   Assessment and Plan: Hematemesis     - admit to inpt, progressive     - continue octreotide, protonix     - fluids     - q6h H&H, transfuse of Hgb < 7     - [facility] onboard, appreciate assistance, he's s/p esophageal variceal banding     - NPO for today  Hyperglycemia     - check A1c  Hyperbilirubinemia Cirrhosis of liver     - check fractionated bili     - RUQ ab [place]  Advance Care Planning:   Code Status: FULL  Consults: [facility] (Dr. Randel Pigg)  Family Communication: w/ family at bedside through interpreter  Severity of Illness: The appropriate patient status for this patient is OBSERVATION. Observation status is judged to be reasonable and necessary in order to provide the required intensity of service to ensure the patient's safety. The patient's presenting symptoms, physical exam findings, and initial radiographic and laboratory data in the context of their medical condition is felt to place them at decreased risk for further clinical deterioration. Furthermore, it is anticipated that the patient will be medically stable for discharge from the hospital within 2 midnights of admission.   Author: Jonnie Finner, DO 02/27/2022 10:29 AM  For on call review [email or web address].

## 2022-02-27 NOTE — Op Note (Signed)
[facility] Patient Name: Daniel Harmon Procedure Date: 02/27/2022 MRN: [id number] Attending MD: Danton Clap DO, DO Date of Birth: 1971-04-12 CSN: [id number] Age: 51 Admit Type: Inpatient Procedure:                Upper GI endoscopy Indications:              Hematemesis Providers:                Danton Clap DO, DO, Carlyn Reichert, RN Referring MD:              Medicines:                See the Anesthesia note for documentation of the                            administered medications Complications:            No immediate complications. Estimated blood loss:                            Minimal. Estimated Blood Loss:     Estimated blood loss was minimal. Procedure:                Pre-Anesthesia Assessment:                           - ASA Grade Assessment: III - A patient with severe                            systemic disease.                           After obtaining informed consent, the endoscope was                            passed under direct vision. Throughout the                            procedure, the patient's blood pressure, pulse, and                            oxygen saturations were monitored continuously.The                            upper GI endoscopy was accomplished without                            difficulty. The patient tolerated the procedure                            well. The [id number] ([id number] Olympus endoscope was                            introduced through the mouth, and advanced to the                            second part of duodenum. Findings:  Large (> 5 mm) varices were found in the lower third of the esophagus.       They were > 5 mm in largest diameter. Five bands were successfully       placed with complete eradication, resulting in deflation of varices.       Bleeding had stopped at the end of the procedure.      The Z-line was regular and was found 42 cm from the incisors.      Red blood was found in the entire  examined stomach. No biopsies or other       specimens were collected for this exam. Good visualization of gastric       mucosa after irrigation and suctioning, gastric varix was not visualized.      Red blood was found in the duodenal bulb.      No gross lesions were noted in the second portion of the duodenum. No       biopsies or other specimens were collected for this exam. 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 Completely                            eradicated. Banded.                           - Z-line regular, 42 cm from the incisors.                           - Red blood in the entire stomach. No specimens                            collected.                           - Blood in the duodenal bulb.                           - No gross lesions in the second portion of the                            duodenum. No specimens collected. Moderate Sedation:      Moderate (conscious) sedation was personally administered by an       anesthesia professional. The following parameters were monitored: oxygen       saturation, heart rate, blood pressure, and response to care. Recommendation:           - Return patient to hospital ward for ongoing care.                           - NPO today.                           - Continue present medications.                           - Check hemoglobin q 6 hours for one day.                           -  Repeat upper endoscopy in 3 weeks for                            surveillance.                           - Return to GI office in 2 weeks. Procedure Code(s):        --- Professional ---                           [phone], Esophagogastroduodenoscopy, flexible,                            transoral; with band ligation of esophageal/gastric                            varices Diagnosis Code(s):        --- Professional ---                           I85.00, Esophageal varices without bleeding                           K92.2, Gastrointestinal  hemorrhage, unspecified                           K92.0, Hematemesis CPT copyright 2019 American Medical Association. All rights reserved. The codes documented in this report are preliminary and upon coder review may  be revised to meet current compliance requirements. Dr Danton Clap, DO Danton Clap DO, DO 02/27/2022 9:16:43 AM Number of Addenda: 0

## 2022-02-27 NOTE — Anesthesia Preprocedure Evaluation (Addendum)
Anesthesia Evaluation  Patient identified by MRN, date of birth, ID band Patient awake    Reviewed: Allergy & Precautions, NPO status , Patient's Chart, lab work & pertinent test results  Airway Mallampati: III  TM Distance: >3 FB Neck ROM: Full    Dental  (+) Partial Upper, Dental Advisory Given   Pulmonary former smoker,    Pulmonary exam normal breath sounds clear to auscultation       Cardiovascular negative cardio ROS Normal cardiovascular exam Rhythm:Regular Rate:Normal     Neuro/Psych negative neurological ROS  negative psych ROS   GI/Hepatic (+) Cirrhosis     substance abuse  alcohol use, Stopped all ETOH use 2 years ago per Hx Hx/o "liver disease" unspecified- per Hx Hematemesis Melena   Endo/Other  Obesity Hyperglycemia  Renal/GU negative Renal ROS  negative genitourinary   Musculoskeletal negative musculoskeletal ROS (+)   Abdominal (+) + obese,   Peds  Hematology  (+) Blood dyscrasia, anemia , Thrombocytopenia-mild   Anesthesia Other Findings   Reproductive/Obstetrics                           Anesthesia Physical Anesthesia Plan  ASA: 3 and emergent  Anesthesia Plan: MAC   Post-op Pain Management: Minimal or no pain anticipated   Induction: Intravenous  PONV Risk Score and Plan: 1 and Treatment may vary due to age or medical condition and Propofol infusion  Airway Management Planned: Natural Airway, Nasal Cannula and Simple Face Mask  Additional Equipment: None  Intra-op Plan:   Post-operative Plan:   Informed Consent: I have reviewed the patients History and Physical, chart, labs and discussed the procedure including the risks, benefits and alternatives for the proposed anesthesia with the patient or authorized representative who has indicated his/her understanding and acceptance.     Dental advisory given  Plan Discussed with: CRNA and  Anesthesiologist  Anesthesia Plan Comments:        Anesthesia Quick Evaluation

## 2022-02-27 NOTE — H&P (View-Only) (Signed)
[facility] Consult  Referring Provider: No ref. provider found Primary Care Physician:  Pcp, No Primary Gastroenterologist: none  Reason for Consultation: hematemesis  SUBJECTIVE:   HPI: Daniel Harmon is a 51 y.o. male with past medical history of unspecified liver disease. He presents from home with his wife for acute onset of hematemesis. HPI largely obtained by patient's spouse at bedside via virtual interpreter service given patient altered mentation during exam.   Patient ate some bison yesterday. Later that evening, roughly 1800, was having frothy bloody emesis. He began to have frank hematemesis with clots ~0200/0300 which prompted his presentation to the hospital. Wife notes he had melena yesterday as well. Etiology of prior liver disease not known, patient stopped all alcohol use 2 years prior. No anticoagulant use. NSAID use with Ibuprofen, last use last night. Having some shortness of breath and epigastric abdominal discomfort. No fevers or chills. No chest pain.  No prior EGD nor colonoscopy. He does not follow with a gastroenterologist in the outpatient setting.  Past Medical History:  Diagnosis Date   Cirrhosis ([place])    Past Surgical History:  Procedure Laterality Date   NO PAST SURGERIES     Prior to Admission medications   Medication Sig Start Date End Date Taking? Authorizing Provider  acetaminophen (TYLENOL) 500 MG tablet Take 500 mg by mouth every 6 (six) hours as needed for headache.    [provider]  HYDROcodone-acetaminophen (NORCO/VICODIN) 5-325 MG per tablet Take 1 tablet by mouth every 4 (four) hours as needed for pain. Patient not taking: Reported on 11/24/2019 10/15/12   Jola Schmidt, MD  ibuprofen (ADVIL,MOTRIN) 600 MG tablet Take 1 tablet (600 mg total) by mouth every 8 (eight) hours as needed for pain. Patient not taking: Reported on 11/24/2019 10/15/12   Jola Schmidt, MD  Ibuprofen (IBU PO) Take 2 tablets by mouth every 6 (six) hours as  needed (pain). Patient not taking: Reported on 11/24/2019    [provider]   Current Facility-Administered Medications  Medication Dose Route Frequency Provider Last Rate Last Admin   0.9 %  sodium chloride infusion  10 mL/hr Intravenous Once Molpus, John, MD       cefTRIAXone (ROCEPHIN) 2 g in sodium chloride 0.9 % 100 mL IVPB  2 g Intravenous Once Molpus, John, MD 200 mL/hr at 02/27/22 0530 2 g at 02/27/22 0530   octreotide (SANDOSTATIN) 2 mcg/mL load via infusion 50 mcg  50 mcg Intravenous Once Molpus, John, MD       And   octreotide (SANDOSTATIN) 500 mcg in sodium chloride 0.9 % 250 mL (2 mcg/mL) infusion  50 mcg/hr Intravenous Continuous Molpus, John, MD       Derrill Memo ON 03/02/2022] pantoprazole (PROTONIX) injection 40 mg  40 mg Intravenous Q12H Molpus, John, MD       pantoprozole (PROTONIX) 80 mg /NS 100 mL infusion  8 mg/hr Intravenous Continuous Molpus, John, MD       Current Outpatient Medications  Medication Sig Dispense Refill   acetaminophen (TYLENOL) 500 MG tablet Take 500 mg by mouth every 6 (six) hours as needed for headache.     HYDROcodone-acetaminophen (NORCO/VICODIN) 5-325 MG per tablet Take 1 tablet by mouth every 4 (four) hours as needed for pain. (Patient not taking: Reported on 11/24/2019) 15 tablet 0   ibuprofen (ADVIL,MOTRIN) 600 MG tablet Take 1 tablet (600 mg total) by mouth every 8 (eight) hours as needed for pain. (Patient not taking: Reported on 11/24/2019) 15 tablet 0  Ibuprofen (IBU PO) Take 2 tablets by mouth every 6 (six) hours as needed (pain). (Patient not taking: Reported on 11/24/2019)     Allergies as of 02/27/2022   (No Known Allergies)   Family History  Problem Relation Age of Onset   Alcoholism Father    Social History   Socioeconomic History   Marital status: Married    Spouse name: Not on file   Number of children: Not on file   Years of education: Not on file   Highest education level: Not on file  Occupational History   Not on  file  Tobacco Use   Smoking status: Former    Types: Cigarettes    Quit date: 07/29/2016    Years since quitting: 5.5   Smokeless tobacco: Never  Substance and Sexual Activity   Alcohol use: Yes    Alcohol/week: 14.0 standard drinks of alcohol    Types: 14 Cans of beer per week   Drug use: No   Sexual activity: Not on file  Other Topics Concern   Not on file  Social History Narrative   Not on file   Social Determinants of Health   Financial Resource Strain: Not on file  Food Insecurity: Not on file  Transportation Needs: Not on file  Physical Activity: Not on file  Stress: Not on file  Social Connections: Not on file  Intimate Partner Violence: Not on file   Review of Systems:  Review of Systems  Constitutional:  Negative for chills and fever.  Respiratory:  Positive for shortness of breath.   Cardiovascular:  Negative for chest pain.  Gastrointestinal:  Positive for abdominal pain, nausea and vomiting.       Hematemesis.    OBJECTIVE:   Temp:  [98.6 F (37 C)] 98.6 F (37 C) (10/15 0550) Pulse Rate:  [86-96] 91 (10/15 0545) Resp:  [20-28] 21 (10/15 0545) BP: (77-133)/(52-62) 99/54 (10/15 0545) SpO2:  [96 %-100 %] 97 % (10/15 0545)   Physical Exam Constitutional:      General: He is in acute distress (active hematemesis during my encounter).     Appearance: He is well-developed and normal weight. He is ill-appearing.  Cardiovascular:     Rate and Rhythm: Normal rate and regular rhythm.  Pulmonary:     Effort: No respiratory distress.     Breath sounds: Normal breath sounds.     Comments: No supplemental oxygen in place at time of exam. Abdominal:     General: Bowel sounds are normal. There is no distension.     Palpations: Abdomen is soft.     Tenderness: There is abdominal tenderness (Epigastric). There is no guarding.  Neurological:     Mental Status: He is easily aroused. He is lethargic.     Labs: Recent Labs    02/27/22 0447  WBC 10.8*  HGB  11.1*  HCT 32.1*  PLT 138*   BMET Recent Labs    02/27/22 0445  NA 141  K 4.0  CL 112*  CO2 22  GLUCOSE 171*  BUN 45*  CREATININE 0.99  CALCIUM 8.7*   LFT Recent Labs    02/27/22 0445  PROT 6.3*  ALBUMIN 3.5  AST 34  ALT 33  ALKPHOS 66  BILITOT 1.6*   PT/INR No results for input(s): "LABPROT", "INR" in the last 72 hours.  Diagnostic imaging: No results found.  IMPRESSION: Hematemesis Normocytic, acute blood loss anemia Thrombocytopenia Hyperbilirubinemia Leukocytosis, mild NSAID use  PLAN: -Large bore IV x 2 -IV  PPI Q12Hr -Octreotide infusion 50 mcg/hr -Rocephin 2 gm IV Q24Hr -Maintain NPO -Ok for 1 unit PRBC given active hematemesis on my encounter -Discussed recommendation for EGD with patient and his spouse at bedside, patient not able to consent for himself given altered mentation, informed consent for EGD with possible endoscopic variceal band ligation obtained from patient's spouse via virtual interpretor -Plan for urgent EGD today   LOS: 0 days   Danton Clap, [facility]

## 2022-02-27 NOTE — ED Provider Notes (Signed)
[facility] DEPT Provider Note: Georgena Spurling, MD, FACEP  CSN: [id number] MRN: [id number] ARRIVAL: 02/27/22 at 0426 ROOM: RESA/RESA   CHIEF COMPLAINT  Hematemesis  Level 5 caveat: Altered mental status HISTORY OF PRESENT ILLNESS  02/27/22 4:53 AM Daniel Harmon is a 51 y.o. male history of alcoholic cirrhosis.  He woke up this morning and he began vomiting blood including large blood clots.  He vomited up a significant amount into an emesis bag in triage and this was collected and sent for Gastroccult testing.  He is not having any pain.  He is mildly short of breath.   As I walked into the patient's room he had just vomited a large amount of blood and went unconscious.  He would not respond to a sternal rub.  Blood pressure was found to be 77 systolic.  I immediately ordered 2 IVs with 1 L bolus of normal saline and 1 L bolus of LR and ordered 2 units of emergency release packed red blood cells.   Past Medical History:  Diagnosis Date   Cirrhosis ([place])     Past Surgical History:  Procedure Laterality Date   NO PAST SURGERIES      Family History  Problem Relation Age of Onset   Alcoholism Father     Social History   Tobacco Use   Smoking status: Former    Types: Cigarettes    Quit date: 07/29/2016    Years since quitting: 5.5   Smokeless tobacco: Never  Substance Use Topics   Alcohol use: Yes    Alcohol/week: 14.0 standard drinks of alcohol    Types: 14 Cans of beer per week   Drug use: No    Prior to Admission medications   Medication Sig Start Date End Date Taking? Authorizing Provider  acetaminophen (TYLENOL) 500 MG tablet Take 500 mg by mouth every 6 (six) hours as needed for headache.    [provider]  HYDROcodone-acetaminophen (NORCO/VICODIN) 5-325 MG per tablet Take 1 tablet by mouth every 4 (four) hours as needed for pain. Patient not taking: Reported on 11/24/2019 10/15/12   Jola Schmidt, MD  ibuprofen (ADVIL,MOTRIN) 600 MG tablet Take 1 tablet  (600 mg total) by mouth every 8 (eight) hours as needed for pain. Patient not taking: Reported on 11/24/2019 10/15/12   Jola Schmidt, MD  Ibuprofen (IBU PO) Take 2 tablets by mouth every 6 (six) hours as needed (pain). Patient not taking: Reported on 11/24/2019    [provider]    Allergies Patient has no known allergies.   REVIEW OF SYSTEMS  Level 5 caveat   PHYSICAL EXAMINATION  Initial Vital Signs Blood pressure 133/62, pulse 90, temperature 98.6 F (37 C), temperature source Oral, resp. rate 20, SpO2 100 %.  Examination General: Well-developed, well-nourished male in no acute distress; appearance consistent with age of record HENT: normocephalic; atraumatic Eyes: pupils equal, round and reactive to light; slight jaundice Neck: supple Heart: regular rate and rhythm Lungs: clear to auscultation bilaterally Abdomen: soft; nondistended; no hepatosplenomegaly; bowel sounds present Extremities: No deformity; full range of motion; pulses normal Neurologic: No response to sternal rub Skin: Warm and dry; slight jaundice   RESULTS  Summary of this visit's results, reviewed and interpreted by myself:   EKG Interpretation  Date/Time:    Ventricular Rate:    PR Interval:    QRS Duration:   QT Interval:    QTC Calculation:   R Axis:     Text Interpretation:  Laboratory Studies: Results for orders placed or performed during the hospital encounter of 02/27/22 (from the past 24 hour(s))  Comprehensive metabolic panel     Status: Abnormal   Collection Time: 02/27/22  4:45 AM  Result Value Ref Range   Sodium 141 135 - 145 mmol/L   Potassium 4.0 3.5 - 5.1 mmol/L   Chloride 112 (H) 98 - 111 mmol/L   CO2 22 22 - 32 mmol/L   Glucose, Bld 171 (H) 70 - 99 mg/dL   BUN 45 (H) 6 - 20 mg/dL   Creatinine, Ser 0.99 0.61 - 1.24 mg/dL   Calcium 8.7 (L) 8.9 - 10.3 mg/dL   Total Protein 6.3 (L) 6.5 - 8.1 g/dL   Albumin 3.5 3.5 - 5.0 g/dL   AST 34 15 - 41 U/L   ALT 33  0 - 44 U/L   Alkaline Phosphatase 66 38 - 126 U/L   Total Bilirubin 1.6 (H) 0.3 - 1.2 mg/dL   GFR, Estimated >60 >60 mL/min   Anion gap 7 5 - 15  Type and screen [facility]     Status: None (Preliminary result)   Collection Time: 02/27/22  4:45 AM  Result Value Ref Range   ABO/RH(D) PENDING    Antibody Screen PENDING    Sample Expiration 03/02/2022,2359    Unit Number [id number]    Blood Component Type RED CELLS,LR    Unit division 00    Status of Unit ALLOCATED    Unit tag comment EMERGENCY RELEASE    Transfusion Status      OK TO TRANSFUSE Performed at [facility], [place] [address], [place], [place] [address]    Crossmatch Result PENDING    Unit Number [id number]    Blood Component Type RED CELLS,LR    Unit division 00    Status of Unit ALLOCATED    Unit tag comment EMERGENCY RELEASE    Transfusion Status OK TO TRANSFUSE    Crossmatch Result PENDING   CBC with Differential     Status: Abnormal   Collection Time: 02/27/22  4:47 AM  Result Value Ref Range   WBC 10.8 (H) 4.0 - 10.5 K/uL   RBC 3.31 (L) 4.22 - 5.81 MIL/uL   Hemoglobin 11.1 (L) 13.0 - 17.0 g/dL   HCT 32.1 (L) 39.0 - 52.0 %   MCV 97.0 80.0 - 100.0 fL   MCH 33.5 26.0 - 34.0 pg   MCHC 34.6 30.0 - 36.0 g/dL   RDW 13.1 11.5 - 15.5 %   Platelets 138 (L) 150 - 400 K/uL   nRBC 0.0 0.0 - 0.2 %   Neutrophils Relative % 78 %   Neutro Abs 8.3 (H) 1.7 - 7.7 K/uL   Lymphocytes Relative 16 %   Lymphs Abs 1.8 0.7 - 4.0 K/uL   Monocytes Relative 6 %   Monocytes Absolute 0.7 0.1 - 1.0 K/uL   Eosinophils Relative 0 %   Eosinophils Absolute 0.0 0.0 - 0.5 K/uL   Basophils Relative 0 %   Basophils Absolute 0.0 0.0 - 0.1 K/uL   Immature Granulocytes 0 %   Abs Immature Granulocytes 0.04 0.00 - 0.07 K/uL  CBG monitoring, ED     Status: Abnormal   Collection Time: 02/27/22  5:02 AM  Result Value Ref Range   Glucose-Capillary 159 (H) 70 - 99 mg/dL   Imaging Studies: No results  found.  ED COURSE and MDM  Nursing notes, initial and subsequent vitals signs, including pulse oximetry,  reviewed and interpreted by myself.  Vitals:   02/27/22 0437 02/27/22 0500 02/27/22 0515  BP: 133/62 (!) 77/52 (!) 90/57  Pulse: 90 96 86  Resp: 20 (!) 28 (!) 24  Temp: 98.6 F (37 C)    TempSrc: Oral    SpO2: 100% 97% 96%   Medications  0.9 %  sodium chloride infusion (has no administration in time range)  octreotide (SANDOSTATIN) 2 mcg/mL load via infusion 50 mcg (has no administration in time range)    And  octreotide (SANDOSTATIN) 500 mcg in sodium chloride 0.9 % 250 mL (2 mcg/mL) infusion (has no administration in time range)  pantoprazole (PROTONIX) injection 40 mg (has no administration in time range)  cefTRIAXone (ROCEPHIN) 2 g in sodium chloride 0.9 % 100 mL IVPB (2 g Intravenous New Bag/Given 02/27/22 0530)  pantoprozole (PROTONIX) 80 mg /NS 100 mL infusion (has no administration in time range)  sodium chloride 0.9 % bolus 1,000 mL (1,000 mLs Intravenous New Bag/Given 02/27/22 0514)  lactated ringers bolus 1,000 mL (1,000 mLs Intravenous New Bag/Given 02/27/22 0513)  pantoprazole (PROTONIX) injection 40 mg (40 mg Intravenous Given 02/27/22 0513)  pantoprazole (PROTONIX) injection 40 mg (40 mg Intravenous Given 02/27/22 0531)   5:08 AM Patient's level of consciousness slightly improved.  He is opening his eyes but not speaking or responding to questions.  Gag reflex intact.  Octreotide drip and Protonix drip ordered.  5:22 AM Discussed with Dr. Randel Pigg of gastroenterology.  She will see patient in the ED.  She asked that we start Rocephin 2 g in addition to the drips already ordered.  5:31 AM Dr. Randel Pigg at bedside.  Patient now able to answer some questions.  He now indicates the vomiting began about 8 PM yesterday evening and had about 6 episodes.  The episodes began small but have progressively worsened until, as indicated above, they became severe while in the ED.  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  5:41 AM Hemoglobin is 11.1 but this was drawn prior to any resuscitative efforts.  With addition of IV fluids to his blood he will likely hemodilute and therefore we will proceed with packed red blood cell transfusion.  7:01 AM Patient being transferred to endoscopy suite for emergency upper endoscopy by Dr. Randel Pigg.   PROCEDURES  Procedures CRITICAL CARE Performed by: Karen Chafe Melitza Metheny Total critical care time: 45 minutes Critical care time was exclusive of separately billable procedures and treating other patients. Critical care was necessary to treat or prevent imminent or life-threatening deterioration. Critical care was time spent personally by me on the following activities: development of treatment plan with patient and/or surrogate as well as nursing, discussions with consultants, evaluation of patient's response to treatment, examination of patient, obtaining history from patient or surrogate, ordering and performing treatments and interventions, ordering and review of laboratory studies, ordering and review of radiographic studies, pulse oximetry and re-evaluation of patient's condition.   ED DIAGNOSES     ICD-10-CM   1. Upper GI bleed  K92.2     2. Hemorrhagic shock ([place])  R57.8          Payden Bonus, Jenny Reichmann, MD 02/27/22 [phone]

## 2022-02-27 NOTE — Consult Note (Signed)
NAME:  Daniel Harmon, MRN:  [id number], DOB:  [birth year], LOS: 0 ADMISSION DATE:  02/27/2022, CONSULTATION DATE:  02/27/22 PM REFERRING MD:  Dr Sunnie Nielsen, CHIEF COMPLAINT:  Circulatory shock - GI bleed   History of Present Illness: from Dr Marylyn Ishihara and chart review and some from wife   51 year old [place] male with a history of cirrhosis.  He presented on 02/27/2022 with hematemesis that was of abrupt onset no recent ethanol use according to chart review but has admitted to using 14 standard drinks of alcohol per week.  Last drink not known.  Denies any drug use.  He was using nonsteroidal anti-inflammatory drugs.  He is a previous smoker having quit 5 years ago.  On 02/27/2022 underwent upper endoscopy by Dr. Randel Pigg of [facility].  History of bleeding esophageal varices in the lower one third of esophagus.  The stomach with fresh blood and large clots.  Duodenal bulb with fresh blood but no active bleeding.  Second portion of the duodenum was unremarkable.  Status post esophageal variceal band ligation x5 minutes with good hemostasis.  Started on IV PPI and IV octreotide and transferred to the floor.  Subsequently around 6 PM 02/27/2022 patient went to the bathroom and then had a large bloody bowel movement that according to the wife mostly described as melena.  Subsequent to this patient had a syncopal episode associated with significant hypotension.  Transferred to the ICU.  On Levophed 10 mcg/min through peripheral intravenous line and systolic blood pressure 85 systolic.  CCM consulted.   Past Medical History:    has a past medical history of Cirrhosis ([place]).   reports that he quit smoking about 5 years ago. His smoking use included cigarettes. He has never used smokeless tobacco.  Past Surgical History:  Procedure Laterality Date   NO PAST SURGERIES      No Known Allergies   There is no immunization history on file for this patient.  Family History  Problem Relation Age of Onset    Alcoholism Father      Current Facility-Administered Medications:    0.9 %  sodium chloride infusion (Manually program via Guardrails IV Fluids), , Intravenous, Once, Kyle, Tyrone A, DO   0.9 %  sodium chloride infusion, 10 mL/hr, Intravenous, Once, [place], Claire H, DO   lactated ringers infusion, , Intravenous, Continuous, Kyle, Tyrone A, DO, Last Rate: 125 mL/hr at 02/27/22 1718, Rate Change at 02/27/22 1718   norepinephrine (LEVOPHED) 30m in 2551m(0.016 mg/mL) premix infusion, 0-40 mcg/min, Intravenous, Titrated, Kyle, Tyrone A, DO   [COMPLETED] octreotide (SANDOSTATIN) 2 mcg/mL load via infusion 50 mcg, 50 mcg, Intravenous, Once, 50 mcg at 02/27/22 0558 **AND** octreotide (SANDOSTATIN) 500 mcg in sodium chloride 0.9 % 250 mL (2 mcg/mL) infusion, 50 mcg/hr, Intravenous, Continuous, VrLoney LaurenceDO, Last Rate: 25 mL/hr at 02/27/22 1517, 50 mcg/hr at 02/27/22 1517   [START ON 03/02/2022] pantoprazole (PROTONIX) injection 40 mg, 40 mg, Intravenous, Q12H, Vreeland, Claire H, DO   pantoprozole (PROTONIX) 80 mg /NS 100 mL infusion, 8 mg/hr, Intravenous, Continuous, VrLoney LaurenceDO, Last Rate: 10 mL/hr at 02/27/22 1613, 8 mg/hr at 02/27/22 1613   prochlorperazine (COMPAZINE) injection 10 mg, 10 mg, Intravenous, Q6H PRN, KyMarylyn IshiharaTyrone A, DO, 10 mg at 02/27/22 1521   sodium chloride 0.9 % bolus 1,000 mL, 1,000 mL, Intravenous, Once PRN, KyMarylyn IshiharaTyrone A, DO   sodium chloride 0.9 % bolus 1,000 mL, 1,000 mL, Intravenous, Once, KyMarylyn IshiharaTyrone A, DO  Significant Hospital Events:  02/27/2022 - admit  02/27/2022: Upper endoscopy status post esophageal variceal ligation subsequent syncope and transferred to the intensive care unit  Interim History / Subjective:   02/27/2022 - seen in bed [facility] intensive care unit  Objective   Blood pressure (!) 96/47, pulse 100, temperature 98 F (36.7 C), resp. rate 18, SpO2 95 %.        Intake/Output Summary (Last 24 hours) at  02/27/2022 1833 Last data filed at 02/27/2022 1700 Gross per 24 hour  Intake 4164.85 ml  Output --  Net 4164.85 ml   There were no vitals filed for this visit.  Examination: General: Very strongly built male.  Lying in bed no distress.  No obvious active bleeding HENT: Supple neck no neck nodes no elevated JVP Lungs: No distress.  Clear to auscultation bilaterally Cardiovascular: Tachycardic normal heart sounds Abdomen: Soft nontender.  No obvious ascites Extremities: No cyanosis no clubbing no edema Neuro: Easily arousable and does follow simple commands. GU: Deferred  Resolved Hospital Problem list   x  Assessment & Plan:    Circulatory shock secondary to hemorrhagic shock secondary to upper GI bleed  - 46/56/8127: Systolic blood pressure 85 - 10 mcg via peripheral line  P:  Levophed via peripheral line Add vasopressin also via peripheral line Milligram for pressure goal greater than 65 Fluid bolus Blood resuscitation Might need intraosseous versus central line   No prior history of cardiac disease but at risk for alcoholic cardiomyopathy   P: Get echocardiogram    Cirrhosis of the liver -not otherwise specified. MELD 14 at admit  Suspected alcoholic.  1st episode of GI Variceal Bleed - s/p banding 02/27/22.    02/27/2022 -Variceal banding might have failed. GI feels high risk for disruption of bands if repeat endoscopy or putting [place] tube  P:   #Bleed  - GI consult - CTA ordered by [facility] Rosset[facility] GI Dr Luan Pulling at 7pm-7am - IR wants this STAT first - IR evaluatioin Needed for  TIPS vs BRTO - urgent.   - D/w Dr Ruthann Cancer of IR -> get CTA stat -> move to [facility] IR suite for  TIPS  #Cirrhosis - work up per GI    Acute blood loss anemia 02/27/22 PM following Variceal banding   P:  - 2 U STAT PRBC ORDERED - PRBC for hgb </= 6.9gm%    - exceptions are   -  if ACS susepcted/confirmed then transfuse for hgb </= 8.0gm%,  or    -  active  bleeding with hemodynamic instability, then transfuse regardless of hemoglobin value   At at all times try to transfuse 1 unit prbc as possible with exception of active hemorrhage    At risk for intubation depending on the severity of bleed and encephalopathy  02/27/2022 -> currently protecting airway  P:   Monitor closely    At risk for hepatic encephalopathy   P:   Monitor closely At some point will need   AT risk for hypo and hyperglycemia     P:   ssi   Best practice (daily eval):  Diet: NPO Pain/Anxiety/Delirium protocol (if indicated): x VAP protocol (if indicated): x DVT prophylaxis: SCD GI prophylaxis: PPI gtt and Octerotide gtt Glucose control: ssi Mobility: bed rest Code Status: full Family Communication: Wife at bedside Disposition: ICU  CCM now primary     [place]   The patient Depaul Arizpe is critically ill with multiple organ systems  failure and requires high complexity decision making for assessment and support, frequent evaluation and titration of therapies, application of advanced monitoring technologies and extensive interpretation of multiple databases.   Critical Care Time devoted to patient care services described in this note is  45  Minutes. This time reflects time of care of this signee Dr Brand Males. This critical care time does not reflect procedure time, or teaching time or supervisory time of PA/NP/Med student/Med Resident etc but could involve care discussion time      SIGNATURE    Dr. Brand Males, M.D., F.C.C.P,  Pulmonary and Critical Care Medicine Staff Physician, [place] Director - Interstitial Lung Disease  Program  Pulmonary [place] at [place], [place], [address]  NPI Number:  NPI #[id number]  Pager: [phone], If no answer  -> Check AMION or Try [phone] Telephone (clinical office): [phone] Telephone (research):  [phone]  6:33 PM 02/27/2022   02/27/2022 6:33 PM    LABS    PULMONARY No results for input(s): "PHART", "PCO2ART", "PO2ART", "HCO3", "TCO2", "O2SAT" in the last 168 hours.  Invalid input(s): "PCO2", "PO2"  CBC Recent Labs  Lab 02/27/22 0447 02/27/22 1020 02/27/22 1658  HGB 11.1* 9.7* 8.3*  HCT 32.1* 28.9* 24.0*  WBC 10.8*  --   --   PLT 138*  --   --     COAGULATION Recent Labs  Lab 02/27/22 0757  INR 1.7*    CARDIAC  No results for input(s): "TROPONINI" in the last 168 hours. No results for input(s): "PROBNP" in the last 168 hours.  CHEMISTRY Recent Labs  Lab 02/27/22 0445  NA 141  K 4.0  CL 112*  CO2 22  GLUCOSE 171*  BUN 45*  CREATININE 0.99  CALCIUM 8.7*   CrCl cannot be calculated (Unknown ideal weight.).   LIVER Recent Labs  Lab 02/27/22 0445 02/27/22 0757 02/27/22 1658  AST 34  --   --   ALT 33  --   --   ALKPHOS 66  --   --   BILITOT 1.6*  --  1.2  PROT 6.3*  --   --   ALBUMIN 3.5  --   --   INR  --  1.7*  --      INFECTIOUS No results for input(s): "LATICACIDVEN", "PROCALCITON" in the last 168 hours.   ENDOCRINE CBG (last 3)  Recent Labs    02/27/22 0502  GLUCAP 159*         IMAGING x48h  - image(s) personally visualized  -   highlighted in bold US Abdomen Complete  Result Date: 02/27/2022 CLINICAL DATA:  Portal hypertension EXAM: ABDOMEN ULTRASOUND COMPLETE COMPARISON:  None Available. FINDINGS: Gallbladder: No gallstones or wall thickening visualized. No sonographic Murphy sign noted by sonographer. Common bile duct: Diameter: 4 mm. Liver: No focal lesion identified. Nodular hepatic surface contour with coarsened, heterogeneous echotexture. Portal vein is patent on color Doppler imaging with normal direction of blood flow towards the liver. IVC: No abnormality visualized. Pancreas: Visualized portion unremarkable. Spleen: Size and appearance within normal limits. Right Kidney: Length: 11.4 cm. Echogenicity  within normal limits. No mass or hydronephrosis visualized. Left Kidney: Length: 11.7 cm. Echogenicity within normal limits. No mass or hydronephrosis visualized. Abdominal aorta: No aneurysm visualized. Other findings: None. IMPRESSION: 1. Cirrhotic appearance of the liver without focal lesion. 2. Remainder of the examination is within normal limits. Electronically Signed   By: Hart Carwin  Plundo D.O.   On: 02/27/2022 11:35

## 2022-02-27 NOTE — Consult Note (Signed)
Chief Complaint: Patient was seen in consultation today for hematemesis  Referring Physician(s): Brand Males, MD  History of Present Illness: Daniel Harmon is a 51 y.o. male with recently diagnosed cirrhosis who presented on 02/27/22 to the [facility] ED for hematemesis.  He was taken for EGD by Dr. Randel Pigg where large esophageal varices were noted and banded.  Earlier this evening the patient had a large bloody bowel movement with associated syncope and hypotension with associated anemia requiring transfusion and pressor support.  I met his wife and several family members at bedside.  He is not communicative at the time.  His wife states that approximately 3 years ago he was seen by a physician in [facility] and told he has an unhealthy liver.  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  She reports he has a very healthy diet now and exercises regularly.  No history of ascites.  No prior GI bleed events prior to current episode.  He's not on blood thinners.  He has no other medical problems.  CTA abdomen/pelvis was performed this evening.  Past Medical History:  Diagnosis Date   Cirrhosis ([place])     Past Surgical History:  Procedure Laterality Date   NO PAST SURGERIES      Allergies: Patient has no known allergies.  Medications: Prior to Admission medications   Medication Sig Start Date End Date Taking? Authorizing Provider  acetaminophen (TYLENOL) 500 MG tablet Take 500 mg by mouth every 6 (six) hours as needed for headache.    [provider]  HYDROcodone-acetaminophen (NORCO/VICODIN) 5-325 MG per tablet Take 1 tablet by mouth every 4 (four) hours as needed for pain. Patient not taking: Reported on 11/24/2019 10/15/12   Jola Schmidt, MD  ibuprofen (ADVIL,MOTRIN) 600 MG tablet Take 1 tablet (600 mg total) by mouth every 8 (eight) hours as needed for pain. Patient not taking: Reported on 11/24/2019 10/15/12   Jola Schmidt, MD  Ibuprofen (IBU PO) Take 2  tablets by mouth every 6 (six) hours as needed (pain). Patient not taking: Reported on 11/24/2019    [provider]     Family History  Problem Relation Age of Onset   Alcoholism Father     Social History   Socioeconomic History   Marital status: Married    Spouse name: Not on file   Number of children: Not on file   Years of education: Not on file   Highest education level: Not on file  Occupational History   Not on file  Tobacco Use   Smoking status: Former    Types: Cigarettes    Quit date: 07/29/2016    Years since quitting: 5.5   Smokeless tobacco: Never  Substance and Sexual Activity   Alcohol use: Yes    Alcohol/week: 14.0 standard drinks of alcohol    Types: 14 Cans of beer per week   Drug use: No   Sexual activity: Not on file  Other Topics Concern   Not on file  Social History Narrative   Not on file   Social Determinants of Health   Financial Resource Strain: Not on file  Food Insecurity: No Food Insecurity (02/27/2022)   Hunger Vital Sign    Worried About Running Out of Food in the Last Year: Never true    Ran Out of Food in the Last Year: Never true  Transportation Needs: No Transportation Needs (02/27/2022)   PRAPARE - Hydrologist (Medical): No  Lack of Transportation (Non-Medical): No  Physical Activity: Not on file  Stress: Not on file  Social Connections: Not on file    Review of Systems: A 12 point ROS discussed and pertinent positives are indicated in the HPI above.  All other systems are negative.   Vital Signs: BP 125/64   Pulse 84   Temp 97.8 F (36.6 C) (Oral)   Resp 20   SpO2 99%    Physical Exam Constitutional:      Appearance: He is ill-appearing.  HENT:     Head: Normocephalic.     Mouth/Throat:     Mouth: Mucous membranes are moist.  Eyes:     General: No scleral icterus. Cardiovascular:     Rate and Rhythm: Regular rhythm. Tachycardia present.  Pulmonary:     Effort: No  respiratory distress.  Abdominal:     General: There is no distension.  Musculoskeletal:        General: Swelling present.  Skin:    General: Skin is warm and dry.     Coloration: Skin is not jaundiced.  Neurological:     Mental Status: He is disoriented.     Imaging: CTA AP 02/27/22  Cirrhosis with gastroesophageal varices, small gastrorenal shunt.  No ectopic varices.  No ascites.  Labs:  CBC: Recent Labs    02/27/22 0447 02/27/22 1020 02/27/22 1658  WBC 10.8*  --   --   HGB 11.1* 9.7* 8.3*  HCT 32.1* 28.9* 24.0*  PLT 138*  --   --     COAGS: Recent Labs    02/27/22 0757 02/27/22 1856  INR 1.7* 1.7*  APTT 30 24    BMP: Recent Labs    02/27/22 0445 02/27/22 1856  NA 141 142  K 4.0 4.0  CL 112* 115*  CO2 22 20*  GLUCOSE 171* 193*  BUN 45* 36*  CALCIUM 8.7* 6.9*  CREATININE 0.99 1.18  GFRNONAA >60 >60  Albumin 3.5  LIVER FUNCTION TESTS: Recent Labs    02/27/22 0445 02/27/22 1658  BILITOT 1.6* 1.2  AST 34  --   ALT 33  --   ALKPHOS 66  --   PROT 6.3*  --   ALBUMIN 3.5  --     Child-Pugh = 7 points, class B MELD = 15 (6% estimated 3 month mortality) [place] Index of Post-TIPS Survival (FIPS) = 0.42 (overall survival predicted at 1 month 93.2%, 3 months 78.7%, and 6 months 69.6%)  Assessment: 51 year old male with 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  He experienced a syncopal event with a large bloody bowel movement this evening, prompting IR consult for evaluation for portal hypertension intervention.  He is currently requiring blood product resuscitation and vasopressor support, but there is no evidence of current bleeding.  He would likely benefit from TIPS creation and is a favorable candidate.  I discussed this procedure as well as alternative approaches to managing hemorrhagic esophageal  varices.  She is amenable to proceed.  Plan: -Transfer to [facility] in anticipation of inpatient TIPS (not performed at [facility]) with general anesthesia -If the patient becomes hemodynamically unstable and/or has continued uncontrolled hematochezia tonight, please page IR and we will coordinate for emergent TIPS -IR team will follow up with patient tomorrow and make definitive arrangements after observation of his overnight course and response to resuscitation   Ruthann Cancer, MD  Pager: [phone] Clinic: [phone]    I spent a total of 40 Minutes  in face to face in clinical consultation, greater than 50% of which was counseling/coordinating care for portal hypertension, hematemesis.

## 2022-02-27 NOTE — Transfer of Care (Signed)
Immediate Anesthesia Transfer of Care Note  Patient: Quantarius Genrich  Procedure(s) Performed: ESOPHAGOGASTRODUODENOSCOPY (EGD) ESOPHAGEAL BANDING  Patient Location: PACU  Anesthesia Type:MAC  Level of Consciousness: sedated, patient cooperative and responds to stimulation  Airway & Oxygen Therapy: Patient Spontanous Breathing and Patient connected to face mask oxygen  Post-op Assessment: Report given to RN and Post -op Vital signs reviewed and stable  Post vital signs: Reviewed and stable  Last Vitals:  Vitals Value Taken Time  BP    Temp    Pulse    Resp    SpO2      Last Pain:  Vitals:   02/27/22 0759  TempSrc: Temporal  PainSc:          Complications: No notable events documented.

## 2022-02-27 NOTE — Interval H&P Note (Signed)
History and Physical Interval Note:  02/27/2022 7:34 AM  Daniel Harmon  has presented today for surgery, with the diagnosis of Hematemesis.  The various methods of treatment have been discussed with the patient and family (his wife at bedside) via virtual interpretor. After consideration of risks, benefits and other options for treatment, the patient 's spouse has consented to  Procedure(s): ESOPHAGOGASTRODUODENOSCOPY (EGD) (N/A) as a surgical intervention.  The patient's history has been reviewed, patient examined, no change in status, stable for surgery.  I have reviewed the patient's chart and labs.  Questions were answered to the patient and his spouse's satisfaction.     Loney Laurence

## 2022-02-27 NOTE — Progress Notes (Addendum)
Called by nursing. Patient syncopized after walking to bathroom. He had large blood bowel movement. Pressures dropped to the 70s. He remains responsive to noxious stim. Moved to ICU. Levophed ordered. 1L NS bolus, 2 units pRBCs ordered emergently. GI paged. PCCM consulted. CBC, BMP ordered.   UPDATE: Spoke with [facility]. Dr. Randel Pigg ordered CTA GIB. Dr. Rosalie Gums to follow up. PCCM has taken over the case.   Jonnie Finner, DO

## 2022-02-27 NOTE — Significant Event (Signed)
Rapid Response Event Note   Reason for Call :  Unresponsive   Initial Focused Assessment:  Patient in bathroom being held up by multiple staff members. Patient lifted to wheelchair and then lifted to bed by multiple staff members. Unresponsive throughout event. Per RN had very large bloody bowel movement. Once back in bed vitals taken and SBP 70s. SpO2 98% RA. Per Rapid protocol, 1L NS bolus started along with BMP, CBC, PT/INR and type and screen.   Transferred to ICU by bedside RN and RRRN. Kyle MD at bedside during event and assisted in transfer to ICU. Levophed started on arrival. Patient began to respond more to voice in ICU.   GI paged Randel Pigg MD) and notified of events. Orders placed to CT Angio by MD.    Interventions:  TX ICU, CBC, BMP, INR/PT, Type and screen, 1 L NS bolus and levophed    Event Summary:   MD NotifiedMarylyn Ishihara MD Call Time: [place] Time: 6734 End Time: 1830  Josph Macho, RN

## 2022-02-27 NOTE — ED Notes (Signed)
Pt vomited appx 500cc of dark red blood in triage

## 2022-02-27 NOTE — Progress Notes (Signed)
BRIEF PROGRESS NOTE:   EGD procedure completed, multiple columns of large non-bleeding esophageal varices in lower 1/3 of esophagus, no active high risk stigmata visualized. Stomach with fresh blood and large clots, irrigation and suction completed and no gastric varix visualized. Duodenal bulb with fresh blood, no site of active bleeding. Second portion of duodenum appeared largely unremarkable. Esophageal variceal band ligation completed x 5 bands with good hemostasis. 1 column of varices with nipple sign and oozing after placement of band, oozing appeared to stop during procedure.   Discussed results of procedure with patient's spouse in post-endoscopy area via virtual interpreter.   Recommend continue IV PPI and IV octreotide for at least 72 hours. Continue NPO for today. Trend H/H, transfuse for Hgb < 7. Will start workup for portal hypertension.   Danton Clap, DO [facility]

## 2022-02-28 ENCOUNTER — Inpatient Hospital Stay (HOSPITAL_COMMUNITY): Payer: Self-pay

## 2022-02-28 DIAGNOSIS — K7030 Alcoholic cirrhosis of liver without ascites: Secondary | ICD-10-CM

## 2022-02-28 DIAGNOSIS — R578 Other shock: Secondary | ICD-10-CM

## 2022-02-28 DIAGNOSIS — K922 Gastrointestinal hemorrhage, unspecified: Secondary | ICD-10-CM

## 2022-02-28 LAB — BASIC METABOLIC PANEL
Anion gap: 7 (ref 5–15)
BUN: 28 mg/dL — ABNORMAL HIGH (ref 6–20)
CO2: 22 mmol/L (ref 22–32)
Calcium: 7.3 mg/dL — ABNORMAL LOW (ref 8.9–10.3)
Chloride: 113 mmol/L — ABNORMAL HIGH (ref 98–111)
Creatinine, Ser: 1.07 mg/dL (ref 0.61–1.24)
GFR, Estimated: >60 mL/min (ref >60)
Glucose, Bld: 132 mg/dL — ABNORMAL HIGH (ref 70–99)
Potassium: 3.9 mmol/L (ref 3.5–5.1)
Sodium: 142 mmol/L (ref 135–145)

## 2022-02-28 LAB — CBC
HCT: 25.5 % — ABNORMAL LOW (ref 39.0–52.0)
Hemoglobin: 9.1 g/dL — ABNORMAL LOW (ref 13.0–17.0)
MCH: 32 pg (ref 26.0–34.0)
MCHC: 35.7 g/dL (ref 30.0–36.0)
MCV: 89.8 fL (ref 80.0–100.0)
Platelets: 97 10*3/uL — ABNORMAL LOW (ref 150–400)
RBC: 2.84 MIL/uL — ABNORMAL LOW (ref 4.22–5.81)
RDW: 16.5 % — ABNORMAL HIGH (ref 11.5–15.5)
WBC: 11.9 10*3/uL — ABNORMAL HIGH (ref 4.0–10.5)
nRBC: 0.3 % — ABNORMAL HIGH (ref 0.0–0.2)

## 2022-02-28 LAB — GLUCOSE, CAPILLARY
Glucose-Capillary: 102 mg/dL — ABNORMAL HIGH (ref 70–99)
Glucose-Capillary: 102 mg/dL — ABNORMAL HIGH (ref 70–99)
Glucose-Capillary: 113 mg/dL — ABNORMAL HIGH (ref 70–99)
Glucose-Capillary: 121 mg/dL — ABNORMAL HIGH (ref 70–99)
Glucose-Capillary: 123 mg/dL — ABNORMAL HIGH (ref 70–99)
Glucose-Capillary: 153 mg/dL — ABNORMAL HIGH (ref 70–99)
Glucose-Capillary: 85 mg/dL (ref 70–99)

## 2022-02-28 LAB — PROTIME-INR
INR: 1.5 — ABNORMAL HIGH (ref 0.8–1.2)
Prothrombin Time: 18 seconds — ABNORMAL HIGH (ref 11.4–15.2)

## 2022-02-28 LAB — SURGICAL PCR SCREEN
MRSA, PCR: NEGATIVE
Staphylococcus aureus: NEGATIVE

## 2022-02-28 LAB — HEMOGLOBIN AND HEMATOCRIT, BLOOD
HCT: 22.4 % — ABNORMAL LOW (ref 39.0–52.0)
HCT: 20.2 % — ABNORMAL LOW (ref 39.0–52.0)
HCT: 20.5 % — ABNORMAL LOW (ref 39.0–52.0)
Hemoglobin: 7.8 g/dL — ABNORMAL LOW (ref 13.0–17.0)
Hemoglobin: 7.1 g/dL — ABNORMAL LOW (ref 13.0–17.0)
Hemoglobin: 7.2 g/dL — ABNORMAL LOW (ref 13.0–17.0)

## 2022-02-28 LAB — ECHOCARDIOGRAM COMPLETE
AR max vel: 3.67 cm2
AV Area VTI: 3.8 cm2
AV Area mean vel: 3.28 cm2
AV Mean grad: 11.5 mmHg
AV Peak grad: 18.7 mmHg
Ao pk vel: 2.16 m/s
Area-P 1/2: 3.21 cm2
S' Lateral: 2.8 cm
Weight: 3520.31 oz

## 2022-02-28 LAB — LACTIC ACID, PLASMA
Lactic Acid, Venous: 2.2 mmol/L (ref 0.5–1.9)
Lactic Acid, Venous: 2.7 mmol/L (ref 0.5–1.9)
Lactic Acid, Venous: 2.7 mmol/L (ref 0.5–1.9)
Lactic Acid, Venous: 4 mmol/L (ref 0.5–1.9)
Lactic Acid, Venous: 2.8 mmol/L (ref 0.5–1.9)
Lactic Acid, Venous: 3 mmol/L (ref 0.5–1.9)

## 2022-02-28 LAB — PREPARE RBC (CROSSMATCH)

## 2022-02-28 LAB — MAGNESIUM: Magnesium: 1.8 mg/dL (ref 1.7–2.4)

## 2022-02-28 MED ORDER — SODIUM CHLORIDE 0.9 % IV SOLN
2.0000 g | INTRAVENOUS | Status: AC
  Administered 2022-02-28 – 2022-03-03 (×3): 2 g via INTRAVENOUS
  Filled 2022-02-28 (×3): qty 20

## 2022-02-28 MED ORDER — ORAL CARE MOUTH RINSE: 15.0000 mL | OROMUCOSAL | Status: DC | PRN

## 2022-02-28 MED ORDER — SODIUM CHLORIDE 0.9 % IV SOLN: INTRAVENOUS | Status: DC | PRN

## 2022-02-28 MED ORDER — SODIUM CHLORIDE 0.9% IV SOLUTION: Freq: Once | INTRAVENOUS | Status: AC

## 2022-02-28 MED ORDER — CALCIUM GLUCONATE-NACL 2-0.675 GM/100ML-% IV SOLN
2.0000 g | Freq: Once | INTRAVENOUS | Status: AC
  Administered 2022-02-28: 2000 mg via INTRAVENOUS
  Filled 2022-02-28: qty 100

## 2022-02-28 MED ORDER — PANTOPRAZOLE SODIUM 40 MG IV SOLR
40.0000 mg | Freq: Two times a day (BID) | INTRAVENOUS | Status: DC
  Administered 2022-02-28 – 2022-03-04 (×9): 40 mg via INTRAVENOUS
  Filled 2022-02-28 (×9): qty 10

## 2022-02-28 MED ORDER — CHLORHEXIDINE GLUCONATE 0.12 % MT SOLN
15.0000 mL | Freq: Once | OROMUCOSAL | Status: AC
  Administered 2022-02-28: 15 mL via OROMUCOSAL
  Filled 2022-02-28: qty 15

## 2022-02-28 NOTE — Progress Notes (Signed)
Referring Physician(s): Dr. James Ivanoff  Supervising Physician: Jacqulynn Cadet  Patient Status:  [facility] - In-pt  Chief Complaint: Hematochezia   Subjective: Resting in bed.  Passed 1 episode of what appeared to be old blood this AM.  Wife and son at bedside.  Has weaned from pressors today.   Allergies: Patient has no known allergies.  Medications: Prior to Admission medications   Medication Sig Start Date End Date Taking? Authorizing Provider  Creatine POWD Take 1 Scoop by mouth daily as needed (after work out).   Yes [provider]  Cyanocobalamin (B-12 PO) Take 1 tablet by mouth daily.   Yes [provider]  Ibuprofen-diphenhydrAMINE Cit (ADVIL PM PO) Take 1 tablet by mouth at bedtime as needed (sleep).   Yes [provider]  MELATONIN PO Take 1 tablet by mouth at bedtime as needed (sleep).   Yes [provider]  OVER THE COUNTER MEDICATION Take 1 capsule by mouth daily. OTC Herbal Detox   Yes [provider]     Vital Signs: BP 112/62 (BP Location: Right Arm)   Pulse 99   Temp 98.1 F (36.7 C) (Oral)   Resp (!) 22   Wt 220 lb 0.3 oz (99.8 kg)   SpO2 95%   BMI 35.36 kg/m   Physical Exam NAD, alert, lying in bed.  Cardio: RRR Abdomen: non-tender, non-distended.   Imaging: CT ANGIO GI BLEED  Result Date: 02/27/2022 CLINICAL DATA:  Hematochezia EXAM: CTA ABDOMEN AND PELVIS WITHOUT AND WITH CONTRAST TECHNIQUE: Multidetector CT imaging of the abdomen and pelvis was performed using the standard protocol during bolus administration of intravenous contrast. Multiplanar reconstructed images and MIPs were obtained and reviewed to evaluate the vascular anatomy. RADIATION DOSE REDUCTION: This exam was performed according to the departmental dose-optimization program which includes automated exposure control, adjustment of the mA and/or kV according to patient size and/or use of iterative reconstruction technique. CONTRAST:  172m  OMNIPAQUE IOHEXOL 350 MG/ML SOLN COMPARISON:  None Available. FINDINGS: VASCULAR Aorta: Initial precontrast images demonstrate no hyperdense crescent to suggest acute aortic injury. Post-contrast images demonstrate no evidence of aneurysmal dilatation or dissection. Celiac: Patent without evidence of aneurysm, dissection, vasculitis or significant stenosis. SMA: Patent without evidence of aneurysm, dissection, vasculitis or significant stenosis. Renals: Dual renal arteries are noted bilaterally. No focal stenoses are seen. IMA: Patent without evidence of aneurysm, dissection, vasculitis or significant stenosis. Inflow: Iliacs are well visualized and within normal limits. Veins: Esophageal and perisplenic varices are noted. Review of the MIP images confirms the above findings. NON-VASCULAR Lower chest: The lung bases are free of acute infiltrate or sizable effusion. Hepatobiliary: Liver demonstrates diffuse nodularity consistent with underlying cirrhosis. A small 1 cm hypodensity is noted in the right lobe near the dome of the diaphragm best seen on image number 9 of series 11. This is too small for adequate characterization but likely represents a small cyst. Pancreas: Unremarkable. No pancreatic ductal dilatation or surrounding inflammatory changes. Spleen: Spleen is within normal limits. Adrenals/Urinary Tract: Adrenal glands are unremarkable. Kidneys demonstrate a normal enhancement pattern bilaterally. No renal calculi or obstructive changes are seen. The bladder is partially distended. Stomach/Bowel: Scattered diverticular change of the colon is noted without evidence of diverticulitis. The appendix is well visualized and within normal limits. No pooling of contrast in the colon is identified to suggest acute colonic hemorrhage. Multiple somewhat nodular areas of increased attenuation are noted in the distal esophagus and GE junction consistent with small varices. No pooling  in the small bowel is noted to  suggest acute small-bowel hemorrhage. Lymphatic: No sizable lymphadenopathy is noted. Reproductive: Prostate is unremarkable. Other: No abdominal wall hernia or abnormality. No abdominopelvic ascites. Musculoskeletal: No acute bony abnormality noted. IMPRESSION: VASCULAR No acute arterial or venous abnormality is noted. No findings to suggest acute GI hemorrhage are seen. Esophageal and splenic varices. NON-VASCULAR Diverticulosis without diverticulitis. Cirrhotic change of the liver with subsequent sequelae of portal hypertension. No evidence of acute GI hemorrhage is seen. Small hypodensity in the right lobe of the liver, too small for characterization but likely representing a small cyst. Electronically Signed   By: Inez Catalina M.D.   On: 02/27/2022 20:58   US Abdomen Complete  Result Date: 02/27/2022 CLINICAL DATA:  Portal hypertension EXAM: ABDOMEN ULTRASOUND COMPLETE COMPARISON:  None Available. FINDINGS: Gallbladder: No gallstones or wall thickening visualized. No sonographic Murphy sign noted by sonographer. Common bile duct: Diameter: 4 mm. Liver: No focal lesion identified. Nodular hepatic surface contour with coarsened, heterogeneous echotexture. Portal vein is patent on color Doppler imaging with normal direction of blood flow towards the liver. IVC: No abnormality visualized. Pancreas: Visualized portion unremarkable. Spleen: Size and appearance within normal limits. Right Kidney: Length: 11.4 cm. Echogenicity within normal limits. No mass or hydronephrosis visualized. Left Kidney: Length: 11.7 cm. Echogenicity within normal limits. No mass or hydronephrosis visualized. Abdominal aorta: No aneurysm visualized. Other findings: None. IMPRESSION: 1. Cirrhotic appearance of the liver without focal lesion. 2. Remainder of the examination is within normal limits. Electronically Signed   By: Davina Poke D.O.   On: 02/27/2022 11:35    Labs:  CBC: Recent Labs    02/27/22 0447 02/27/22 1020  02/27/22 1658 02/28/22 0403  WBC 10.8*  --   --  11.9*  HGB 11.1* 9.7* 8.3* 9.1*  HCT 32.1* 28.9* 24.0* 25.5*  PLT 138*  --   --  97*    COAGS: Recent Labs    02/27/22 0757 02/27/22 1856 02/28/22 0403  INR 1.7* 1.7* 1.5*  APTT 30 24  --     BMP: Recent Labs    02/27/22 0445 02/27/22 1856 02/28/22 0403  NA 141 142 142  K 4.0 4.0 3.9  CL 112* 115* 113*  CO2 22 20* 22  GLUCOSE 171* 193* 132*  BUN 45* 36* 28*  CALCIUM 8.7* 6.9* 7.3*  CREATININE 0.99 1.18 1.07  GFRNONAA >60 >60 >60    LIVER FUNCTION TESTS: Recent Labs    02/27/22 0445 02/27/22 1658  BILITOT 1.6* 1.2  AST 34  --   ALT 33  --   ALKPHOS 66  --   PROT 6.3*  --   ALBUMIN 3.5  --     Assessment and Plan: GI bleed Patient is s/p banding of esophageal varices by GI 10/15.  Then had at least 1 episode of BRBPR with associated hypotension requiring 2 additional units of PRBCs and pressors.  Overnight he has remained stable with resusitation.  He has transferred to the ICU at [facility].  His MELDNa= 12 He has been able to wean from pressors.  IR following for anticipated TIPS procedure as an inpatient.  Will coordinate with anesthesia; aiming for Wednesday if schedule allows.  Family updated at bedside.   Risks and benefits of TIPS, BRTO and/or additional variceal embolization were discussed with the patient and/or the patient's family including, but not limited to, infection, bleeding, damage to adjacent structures, worsening hepatic and/or cardiac function, worsening and/or the development of altered mental status/encephalopathy,  non-target embolization and death.   This interventional procedure involves the use of X-rays and because of the nature of the planned procedure, it is possible that we will have prolonged use of X-ray fluoroscopy.  Potential radiation risks to you include (but are not limited to) the following: - A slightly elevated risk for cancer  several years later in life. This risk is  typically less than 0.5% percent. This risk is low in comparison to the normal incidence of human cancer, which is 33% for women and 50% for men according to the [employer]. - Radiation induced injury can include skin redness, resembling a rash, tissue breakdown / ulcers and hair loss (which can be temporary or permanent).   The likelihood of either of these occurring depends on the difficulty of the procedure and whether you are sensitive to radiation due to previous procedures, disease, or genetic conditions.   IF your procedure requires a prolonged use of radiation, you will be notified and given written instructions for further action.  It is your responsibility to monitor the irradiated area for the 2 weeks following the procedure and to notify your physician if you are concerned that you have suffered a radiation induced injury.    All of the patient's questions were answered, patient is agreeable to proceed.  Consent signed and in chart.  Electronically Signed: Docia Barrier, PA 02/28/2022, 9:14 AM   I spent a total of 15 Minutes at the the patient's bedside AND on the patient's hospital floor or unit, greater than 50% of which was counseling/coordinating care for GI bleed.

## 2022-02-28 NOTE — Progress Notes (Signed)
Date and time results received: 02/28/22 0511 (use smartphrase ".now" to insert current time)  Test: Lactic Acid Critical Value: 2.2  Name of Provider Notified: Dr. James Ivanoff [facility])  Orders Received? Or Actions Taken?:  Awaiting orders

## 2022-02-28 NOTE — Progress Notes (Signed)
[facility] Progress Note  Daniel Harmon 51 y.o. 12-09-1970  CC: GI bleed   Subjective: Patient seen and examined at bedside in ICU.  Family and RN at bedside.  Continues to have melanotic stool.  Yesterday's events noted.  Case was discussed with Dr. Randel Pigg.  Hemoglobin stable this morning.  ROS : Afebrile, negative for chest pain, negative for abdominal pain   Objective: Vital signs in last 24 hours: Vitals:   02/28/22 0728 02/28/22 0800  BP: 106/63 112/62  Pulse: 91 99  Resp: 20 (!) 22  Temp: 98.1 F (36.7 C)   SpO2: 96% 95%    Physical Exam:  General:  Alert, cooperative, no distress, appears stated age  Head:  Normocephalic, without obvious abnormality, atraumatic  Eyes:  , EOM's intact,   Lungs:   Clear to auscultation bilaterally, respirations unlabored  Heart:  Regular rate and rhythm, S1, S2 normal  Abdomen:   Soft, non-tender, nondistended, bowel sounds present, no peritoneal signs  Extremities: Extremities normal, atraumatic, no  edema  Pulses: 2+ and symmetric    Lab Results: Recent Labs    02/27/22 1856 02/28/22 0403  NA 142 142  K 4.0 3.9  CL 115* 113*  CO2 20* 22  GLUCOSE 193* 132*  BUN 36* 28*  CREATININE 1.18 1.07  CALCIUM 6.9* 7.3*  MG  --  1.8   Recent Labs    02/27/22 0445 02/27/22 1658  AST 34  --   ALT 33  --   ALKPHOS 66  --   BILITOT 1.6* 1.2  PROT 6.3*  --   ALBUMIN 3.5  --    Recent Labs    02/27/22 0447 02/27/22 1020 02/27/22 1658 02/28/22 0403  WBC 10.8*  --   --  11.9*  NEUTROABS 8.3*  --   --   --   HGB 11.1*   < > 8.3* 9.1*  HCT 32.1*   < > 24.0* 25.5*  MCV 97.0  --   --  89.8  PLT 138*  --   --  97*   < > = values in this interval not displayed.   Recent Labs    02/27/22 1856 02/28/22 0403  LABPROT 20.1* 18.0*  INR 1.7* 1.5*      Assessment/Plan: -Variceal bleeding.  Status post EGD with band ligation 10/15 with 5 bands placement.  Patient with ongoing bleeding melanotic stool.  CT angio  negative for active bleeding.  IR has been consulted and there is a plan for TIPS placement during this admission. -Decompensated cirrhosis.  Prior alcohol use.  Quit 3 years ago. -History of NSAID use  Recommendations -------------------------- -Appreciate ICU intervention radiology support.  Plan for TIPS placement during this admission. -Add Rocephin for prophylaxis for bleeding in setting of cirrhosis -Continue other supportive care with octreotide, PPI  -follow H&H.  Transfuse if hemoglobin less than 7. -GI will follow -Case was discussed with Dr. Loura Halt MD, [place] 02/28/2022, 9:22 AM  Contact #  [phone]

## 2022-02-28 NOTE — Progress Notes (Signed)
NAME:  Daniel Harmon, MRN:  [id number], DOB:  1970-06-07, LOS: 1 ADMISSION DATE:  02/27/2022, CONSULTATION DATE:  02/27/22 REFERRING MD:  Dr. Marylyn Ishihara, CHIEF COMPLAINT:  Hemorrhagic Shock   History of Present Illness:  51 year old male with a history of cirrhosis.  He presented on 02/27/2022 with hematemesis that was of abrupt onset no recent ethanol use according to chart review but has admitted to using 14 standard drinks of alcohol per week.  Last drink not known.  Denies any drug use.  He was using nonsteroidal anti-inflammatory drugs.  He is a previous smoker having quit 5 years ago.  On 02/27/2022 underwent upper endoscopy by Dr. Randel Pigg of [facility] with variceal banding.  History of bleeding esophageal varices in the lower one third of esophagus.  The stomach with fresh blood and large clots.  Duodenal bulb with fresh blood but no active bleeding.  Second portion of the duodenum was unremarkable.  Status post esophageal variceal band ligation x5 minutes with good hemostasis.  Started on IV PPI and IV octreotide and transferred to the floor.  Subsequently around 6 PM 02/27/2022 patient went to the bathroom and then had a large bloody bowel movement that according to the wife mostly described as melena.  Subsequent to this patient had a syncopal episode associated with significant hypotension.  Transferred to the ICU.  Initially started on  Levophed 10 mcg/min through peripheral intravenous line and systolic blood pressure 85 systolic.  CCM consulted and pt transferred to [facility] from [facility].   Initial labs with hgb 11.1, normal Cr but elevated BUN. Negative hepatitis panel.  Pt given 2 units yesterday of pRBC.    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 Doing well.  Review of Systems:   Negative unless stated in the subjective.  Objective   Blood pressure (!) 126/55, pulse 85, temperature 98.1 F (36.7 C), temperature source Oral, resp. rate 15, weight 99.8 kg, SpO2 97 %.        Intake/Output Summary (Last 24 hours) at 02/28/2022 1110 Last data filed at 02/28/2022 1000 Gross per 24 hour  Intake 3387.3 ml  Output 300 ml  Net 3087.3 ml   Filed Weights   02/28/22 0104  Weight: 99.8 kg   Examination: General: NAD HENT: NCAT, MMM, anicteric sclera.  Lungs: No distress.  Clear to auscultation bilaterally Cardiovascular: Tachycardic normal heart sounds Abdomen: Soft, slight TTP.  No obvious ascites Extremities: No asymmetry. SCD in place Neuro: alert and oriented x4 GU: No foley.    Consults  IR, GI Resolved Hospital Problem list    Assessment & Plan:  Hemorrhagic shock secondary to upper GI bleed S/p 2 units of pRBCs. Had some pressor requirement but without any pressor support this am, current MAP at 75 without pressors. Pt with report of dark BM which appears to be blood from previous GI bleed given stable hgb.  -Continue to trend hgb q4hrs.    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 MELD 14 at admit Suspected alcoholic. 1st episode of GI Variceal Bleed - s/p banding 02/27/22.   Hx of alcohol use but none since last 3 years. On PPI BID, Octreotide gtt,  and Rocephin 2 g qd.  Plan for TIPS by IR.  -Continue PPI, Octreotide, and Rocephin.  -Follow up IR with planned TIPS.    Hypocalcemia Calcium 7.3, corrected 7.7.  -Give 2 grams of calcium.    Hyperglycemia of Critical Illness On SSI. Goal 140-180. Last CBG is 121. A1c 5.1. Best Practice (right click and "Reselect all SmartList Selections" daily)  Diet/type: NPO DVT prophylaxis: SCD GI prophylaxis: PPI Lines: N/A Foley:   N/A Continuous: Octreotide, LR  Code Status:  full code Last date of multidisciplinary goals of care discussion [02/28/22] Labs   CBC: Recent Labs  Lab 02/27/22 0447 02/27/22 1020 02/27/22 1658 02/28/22 0403  WBC 10.8*  --   --  11.9*  NEUTROABS 8.3*  --   --   --   HGB 11.1* 9.7* 8.3* 9.1*  HCT 32.1* 28.9* 24.0* 25.5*  MCV 97.0  --   --  89.8  PLT 138*  --   --  97*   Basic Metabolic Panel: Recent Labs  Lab 02/27/22 0445 02/27/22 1856 02/28/22 0403  NA 141 142 142  K 4.0 4.0 3.9  CL 112* 115* 113*  CO2 22 20* 22  GLUCOSE 171* 193* 132*  BUN 45* 36* 28*  CREATININE 0.99 1.18 1.07  CALCIUM 8.7* 6.9* 7.3*  MG  --   --  1.8   GFR: CrCl cannot be calculated (Unknown ideal weight.). Recent Labs  Lab 02/27/22 0447 02/28/22 0403 02/28/22 0930  WBC 10.8* 11.9*  --   LATICACIDVEN  --  2.2* 2.7*   Liver Function Tests: Recent Labs  Lab 02/27/22 0445 02/27/22 1658  AST 34  --   ALT 33  --   ALKPHOS 66  --   BILITOT 1.6* 1.2  PROT 6.3*  --   ALBUMIN 3.5  --    No results for input(s): "LIPASE", "AMYLASE" in the last 168 hours. No results for input(s): "AMMONIA" in the last 168 hours. ABG No results found for: "PHART", "PCO2ART", "PO2ART", "HCO3", "TCO2", "ACIDBASEDEF", "O2SAT"  Coagulation Profile: Recent Labs  Lab 02/27/22 0757 02/27/22 1856 02/28/22 0403  INR 1.7* 1.7* 1.5*   Cardiac Enzymes: No results for input(s): "CKTOTAL", "CKMB", "CKMBINDEX", "TROPONINI" in the last 168 hours. HbA1C: Hgb A1c MFr Bld  Date/Time Value Ref Range Status  02/27/2022 04:58 PM 5.1 4.8 - 5.6 % Final    Comment:    (NOTE) Pre diabetes:          5.7%-6.4%  Diabetes:              >6.4%  Glycemic control for   <7.0% adults with diabetes    CBG: Recent Labs  Lab 02/27/22 2104 02/27/22 2348 02/28/22 0055 02/28/22 0316 02/28/22 0723  GLUCAP 150* 137* 123* 113* 121*   Past Medical History:  He,  has a past medical history of Cirrhosis ([place]).  Surgical  History:   Past Surgical History:  Procedure Laterality Date   NO PAST SURGERIES      Social History:   reports that he quit smoking about 5 years ago. His smoking use included cigarettes. He has never used smokeless tobacco. He reports current alcohol use of about 14.0 standard drinks of alcohol per week. He reports that he does not use drugs.  Family History:  His family history includes Alcoholism in his father.  Allergies No Known Allergies  Home Medications  Prior to Admission medications   Medication Sig Start Date End Date Taking? Authorizing Provider  Creatine POWD Take  1 Scoop by mouth daily as needed (after work out).   Yes [provider]  Cyanocobalamin (B-12 PO) Take 1 tablet by mouth daily.   Yes [provider]  Ibuprofen-diphenhydrAMINE Cit (ADVIL PM PO) Take 1 tablet by mouth at bedtime as needed (sleep).   Yes [provider]  MELATONIN PO Take 1 tablet by mouth at bedtime as needed (sleep).   Yes [provider]  OVER THE COUNTER MEDICATION Take 1 capsule by mouth daily. OTC Herbal Detox   Yes [provider]    Idamae Schuller, MD Tillie Rung. [facility] Internal Medicine Residency, PGY-2

## 2022-02-28 NOTE — Progress Notes (Addendum)
eLink Physician-Brief Progress Note Patient Name: Daniel Harmon DOB: 1970-09-17 MRN: [id number]   Date of Service  02/28/2022  HPI/Events of Note  Pt transferred from [facility] after transfusion of 2 units pRBCs.  No recurrence of hematochezia or melena since the transfer.   Pt remains on low dose levophed and vasopressin through peripheral line.   BP 118/68, HR 79, RR 17, o2 sats 95%.   eICU Interventions  Check CBC, BMP, lactic acid, coags now.      Intervention Category Intermediate Interventions: Other:  Elsie Lincoln 02/28/2022, 3:44 AM  5:17 AM H&H 9.1/25.5 <-- 8.3/24. Crea 1.07. Lactic acid 2.2.  Pt has had small dark stool since arriving in [facility].   Plan> Continue current management.  Trend lactate.

## 2022-02-28 NOTE — Progress Notes (Signed)
  Echocardiogram 2D Echocardiogram has been performed.  Daniel Harmon 02/28/2022, 3:46 PM

## 2022-03-01 ENCOUNTER — Other Ambulatory Visit: Payer: Self-pay | Admitting: Radiology

## 2022-03-01 ENCOUNTER — Encounter (HOSPITAL_COMMUNITY): Payer: Self-pay | Admitting: Internal Medicine

## 2022-03-01 ENCOUNTER — Other Ambulatory Visit (HOSPITAL_COMMUNITY): Payer: Self-pay

## 2022-03-01 DIAGNOSIS — K920 Hematemesis: Secondary | ICD-10-CM

## 2022-03-01 LAB — LACTIC ACID, PLASMA
Lactic Acid, Venous: 2.7 mmol/L (ref 0.5–1.9)
Lactic Acid, Venous: 1.5 mmol/L (ref 0.5–1.9)

## 2022-03-01 LAB — CBC
HCT: 21.2 % — ABNORMAL LOW (ref 39.0–52.0)
Hemoglobin: 7.3 g/dL — ABNORMAL LOW (ref 13.0–17.0)
MCH: 31.2 pg (ref 26.0–34.0)
MCHC: 34.4 g/dL (ref 30.0–36.0)
MCV: 90.6 fL (ref 80.0–100.0)
Platelets: 65 10*3/uL — ABNORMAL LOW (ref 150–400)
RBC: 2.34 MIL/uL — ABNORMAL LOW (ref 4.22–5.81)
RDW: 16.3 % — ABNORMAL HIGH (ref 11.5–15.5)
WBC: 9.5 10*3/uL (ref 4.0–10.5)
nRBC: 1.8 % — ABNORMAL HIGH (ref 0.0–0.2)

## 2022-03-01 LAB — BASIC METABOLIC PANEL
Anion gap: 4 — ABNORMAL LOW (ref 5–15)
BUN: 20 mg/dL (ref 6–20)
CO2: 26 mmol/L (ref 22–32)
Calcium: 7.3 mg/dL — ABNORMAL LOW (ref 8.9–10.3)
Chloride: 113 mmol/L — ABNORMAL HIGH (ref 98–111)
Creatinine, Ser: 0.98 mg/dL (ref 0.61–1.24)
GFR, Estimated: >60 mL/min (ref >60)
Glucose, Bld: 115 mg/dL — ABNORMAL HIGH (ref 70–99)
Potassium: 3.6 mmol/L (ref 3.5–5.1)
Sodium: 143 mmol/L (ref 135–145)

## 2022-03-01 LAB — PREPARE RBC (CROSSMATCH)

## 2022-03-01 LAB — GLUCOSE, CAPILLARY
Glucose-Capillary: 111 mg/dL — ABNORMAL HIGH (ref 70–99)
Glucose-Capillary: 108 mg/dL — ABNORMAL HIGH (ref 70–99)
Glucose-Capillary: 100 mg/dL — ABNORMAL HIGH (ref 70–99)
Glucose-Capillary: 121 mg/dL — ABNORMAL HIGH (ref 70–99)
Glucose-Capillary: 92 mg/dL (ref 70–99)
Glucose-Capillary: 102 mg/dL — ABNORMAL HIGH (ref 70–99)

## 2022-03-01 LAB — HEMOGLOBIN AND HEMATOCRIT, BLOOD
HCT: 20.9 % — ABNORMAL LOW (ref 39.0–52.0)
HCT: 23.7 % — ABNORMAL LOW (ref 39.0–52.0)
Hemoglobin: 7.2 g/dL — ABNORMAL LOW (ref 13.0–17.0)
Hemoglobin: 8.4 g/dL — ABNORMAL LOW (ref 13.0–17.0)

## 2022-03-01 MED ORDER — POTASSIUM CHLORIDE 10 MEQ/100ML IV SOLN: 10.0000 meq | INTRAVENOUS | Status: DC

## 2022-03-01 MED ORDER — SODIUM CHLORIDE 0.9% IV SOLUTION: Freq: Once | INTRAVENOUS | Status: AC

## 2022-03-01 MED ORDER — POTASSIUM CHLORIDE 20 MEQ PO PACK
40.0000 meq | PACK | Freq: Once | ORAL | Status: AC
  Administered 2022-03-01: 40 meq via ORAL
  Filled 2022-03-01: qty 2

## 2022-03-01 MED ORDER — CALCIUM GLUCONATE-NACL 2-0.675 GM/100ML-% IV SOLN
2.0000 g | Freq: Once | INTRAVENOUS | Status: AC
  Administered 2022-03-01: 2000 mg via INTRAVENOUS
  Filled 2022-03-01: qty 100

## 2022-03-01 NOTE — Progress Notes (Addendum)
NAME:  Daniel Harmon, MRN:  [id number], DOB:  08-30-1970, LOS: 2 ADMISSION DATE:  02/27/2022, CONSULTATION DATE:  02/27/22 REFERRING MD:  Dr. Marylyn Ishihara, CHIEF COMPLAINT:  Hemorrhagic Shock   History of Present Illness:  51 year old male with a history of cirrhosis.  He presented on 02/27/2022 with hematemesis that was of abrupt onset no recent ethanol use according to chart review but has admitted to using 14 standard drinks of alcohol per week.  Last drink not known.  Denies any drug use.  He was using nonsteroidal anti-inflammatory drugs.  He is a previous smoker having quit 5 years ago.  On 02/27/2022 underwent upper endoscopy by Dr. Randel Pigg of [facility] with variceal banding.  History of bleeding esophageal varices in the lower one third of esophagus.  The stomach with fresh blood and large clots.  Duodenal bulb with fresh blood but no active bleeding.  Second portion of the duodenum was unremarkable.  Status post esophageal variceal band ligation x5 minutes with good hemostasis.  Started on IV PPI and IV octreotide and transferred to the floor.  Subsequently around 6 PM 02/27/2022 patient went to the bathroom and then had a large bloody bowel movement that according to the wife mostly described as melena.  Subsequent to this patient had a syncopal episode associated with significant hypotension.  Transferred to the ICU.  Initially started on  Levophed 10 mcg/min through peripheral intravenous line and systolic blood pressure 85 systolic.  CCM consulted and pt transferred to [facility] from [facility].   Initial labs with hgb 11.1, normal Cr but elevated BUN. Negative hepatitis panel.  Pt given 2 units yesterday of pRBC.    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  Review of Systems:   Negative unless stated in the subjective.  Objective: febrile with 100.5 tmax with temp of 99.7 this am. BP with MAP >65. Satting well on [place].   Blood pressure (!) 107/56, pulse 90, temperature 99.7 F (37.6 C), temperature source Axillary, resp. rate 20, weight 99.8 kg, SpO2 94 %.        Intake/Output Summary (Last 24 hours) at 03/01/2022 0702 Last data filed at 03/01/2022 0600 Gross per 24 hour  Intake 4550.36 ml  Output 1500 ml  Net 3050.36 ml    Filed Weights   02/28/22 0104  Weight: 99.8 kg   Intake: 4.5 L Output: 1.5 L Stool: 5x  Examination: General: NAD HENT: NCAT, MMM, anicteric sclera.  Lungs: No distress.  Clear to auscultation bilaterally Cardiovascular: Tachycardic normal heart sounds Abdomen: Soft, slight TTP.  No obvious ascites Extremities: No asymmetry. SCD in place Neuro: alert and oriented x4 GU: No foley.    Consults  IR, GI Resolved Hospital Problem list    Assessment & Plan:  ABLA 2/2 to UGI S/p 2 units of pRBCs yesterday with total of 6 units.  Hgb 7.3 this am. Multiple bloody BM yesterday. Plan for TIPS tomorrow by IR. Will give 1 unit today.  -Continue to trend hgb q8hrs.    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 MELD 14 at admit Suspected alcoholic 1st episode of GI Variceal  Bleed - s/p banding 02/27/22.   Hx of alcohol use but none since last 3 years. On PPI BID, Octreotide gtt, and Rocephin 2 g qd.  Plan for TIPS by IR.  -Continue PPI, Octreotide, and Rocephin. 5 days of octreotide and rocephin.  -Follow up IR with planned TIPS. NPO after midnight.    Hypocalcemia Calcium 7.3, corrected 7.7 s/p 2 g yesterday.  -Give 2 grams of calcium today.    Hyperglycemia of Critical Illness On SSI. Goal 140-180. Last CBG is 111. A1c 5.1.  Febrile  non-hemolytic transfusion rxn Pt febrile at 100.5 post transfusion yesterday. HDS. Will monitor with repeat transfusion and give tylenol as needed.   Best Practice (right click and "Reselect all SmartList Selections" daily)  Diet/type: CLD DVT prophylaxis: SCD GI prophylaxis: PPI Lines: N/A Foley:  N/A Continuous: Octreotide, LR  Code Status:  full code Last date of multidisciplinary goals of care discussion [02/28/22] Labs: CBC with normal WBC, Hgb 7.3, Plt 65k. BMP with normal K and normal Na. Calcium low at 7.3.  LA resolved.   CBC: Recent Labs  Lab 02/27/22 0447 02/27/22 1020 02/28/22 0403 02/28/22 1226 02/28/22 1711 02/28/22 2126 03/01/22 0340  WBC 10.8*  --  11.9*  --   --   --  9.5  NEUTROABS 8.3*  --   --   --   --   --   --   HGB 11.1*   < > 9.1* 7.8* 7.1* 7.2* 7.3*  HCT 32.1*   < > 25.5* 22.4* 20.2* 20.5* 21.2*  MCV 97.0  --  89.8  --   --   --  90.6  PLT 138*  --  97*  --   --   --  65*   < > = values in this interval not displayed.    Basic Metabolic Panel: Recent Labs  Lab 02/27/22 0445 02/27/22 1856 02/28/22 0403 03/01/22 0340  NA 141 142 142 143  K 4.0 4.0 3.9 3.6  CL 112* 115* 113* 113*  CO2 22 20* 22 26  GLUCOSE 171* 193* 132* 115*  BUN 45* 36* 28* 20  CREATININE 0.99 1.18 1.07 0.98  CALCIUM 8.7* 6.9* 7.3* 7.3*  MG  --   --  1.8  --     GFR: CrCl cannot be calculated (Unknown ideal weight.). Recent Labs  Lab 02/27/22 0447 02/28/22 0403 02/28/22 0930 02/28/22 1713 02/28/22 2126 03/01/22 0025 03/01/22 0340  WBC 10.8* 11.9*  --   --   --   --  9.5  LATICACIDVEN  --  2.2*   < > 2.8* 3.0* 2.7* 1.5   < > = values in this interval not displayed.    Liver Function Tests: Recent Labs  Lab 02/27/22 0445 02/27/22 1658  AST 34  --   ALT 33  --   ALKPHOS 66  --   BILITOT 1.6* 1.2  PROT 6.3*  --   ALBUMIN 3.5  --     No results for input(s): "LIPASE", "AMYLASE" in the last 168 hours. No results for input(s): "AMMONIA" in the last 168  hours. ABG No results found for: "PHART", "PCO2ART", "PO2ART", "HCO3", "TCO2", "ACIDBASEDEF", "O2SAT"  Coagulation Profile: Recent Labs  Lab 02/27/22 0757 02/27/22 1856 02/28/22 0403  INR 1.7* 1.7* 1.5*    Cardiac Enzymes: No results for input(s): "CKTOTAL", "CKMB", "CKMBINDEX", "TROPONINI" in the last 168 hours. HbA1C: Hgb A1c MFr Bld  Date/Time Value Ref Range Status  02/27/2022 04:58 PM 5.1 4.8 - 5.6 %  Final    Comment:    (NOTE) Pre diabetes:          5.7%-6.4%  Diabetes:              >6.4%  Glycemic control for   <7.0% adults with diabetes    CBG: Recent Labs  Lab 02/28/22 1132 02/28/22 1537 02/28/22 1919 02/28/22 2330 03/01/22 0319  GLUCAP 102* 153* 85 102* 111*    Past Medical History:  He,  has a past medical history of Cirrhosis ([place]).  Surgical History:   Past Surgical History:  Procedure Laterality Date   NO PAST SURGERIES      Social History:   reports that he quit smoking about 5 years ago. His smoking use included cigarettes. He has never used smokeless tobacco. He reports current alcohol use of about 14.0 standard drinks of alcohol per week. He reports that he does not use drugs.  Family History:  His family history includes Alcoholism in his father.  Allergies No Known Allergies  Home Medications  Prior to Admission medications   Medication Sig Start Date End Date Taking? Authorizing Provider  Creatine POWD Take 1 Scoop by mouth daily as needed (after work out).   Yes [provider]  Cyanocobalamin (B-12 PO) Take 1 tablet by mouth daily.   Yes [provider]  Ibuprofen-diphenhydrAMINE Cit (ADVIL PM PO) Take 1 tablet by mouth at bedtime as needed (sleep).   Yes [provider]  MELATONIN PO Take 1 tablet by mouth at bedtime as needed (sleep).   Yes [provider]  OVER THE COUNTER MEDICATION Take 1 capsule by mouth daily. OTC Herbal Detox   Yes [provider]    Idamae Schuller, MD Tillie Rung. [facility] Internal Medicine Residency, PGY-2

## 2022-03-01 NOTE — Progress Notes (Signed)
[facility] Progress Note  Daniel Harmon 51 y.o. Jan 02, 1971  CC: GI bleed   Subjective: Patient seen and examined at bedside in ICU.  Family bedside.  Continues to have melanotic stool.  No significant changes compared to yesterday.  ROS : Afebrile, negative for chest pain, negative for abdominal pain   Objective: Vital signs in last 24 hours: Vitals:   03/01/22 1030 03/01/22 1045  BP: 122/66   Pulse: 79 82  Resp: 17 18  Temp:    SpO2: 98% 96%    Physical Exam:  General:  Alert, cooperative, no distress, appears stated age  Head:  Normocephalic, without obvious abnormality, atraumatic  Eyes:  , EOM's intact,   Lungs:   Clear to auscultation bilaterally, respirations unlabored  Heart:  Regular rate and rhythm, S1, S2 normal  Abdomen:   Soft, non-tender, nondistended, bowel sounds present, no peritoneal signs  Extremities: Extremities normal, atraumatic, no  edema  Pulses: 2+ and symmetric    Lab Results: Recent Labs    02/28/22 0403 03/01/22 0340  NA 142 143  K 3.9 3.6  CL 113* 113*  CO2 22 26  GLUCOSE 132* 115*  BUN 28* 20  CREATININE 1.07 0.98  CALCIUM 7.3* 7.3*  MG 1.8  --    Recent Labs    02/27/22 0445 02/27/22 1658  AST 34  --   ALT 33  --   ALKPHOS 66  --   BILITOT 1.6* 1.2  PROT 6.3*  --   ALBUMIN 3.5  --    Recent Labs    02/27/22 0447 02/27/22 1020 02/28/22 0403 02/28/22 1226 02/28/22 2126 03/01/22 0340  WBC 10.8*  --  11.9*  --   --  9.5  NEUTROABS 8.3*  --   --   --   --   --   HGB 11.1*   < > 9.1*   < > 7.2* 7.3*  HCT 32.1*   < > 25.5*   < > 20.5* 21.2*  MCV 97.0  --  89.8  --   --  90.6  PLT 138*  --  97*  --   --  65*   < > = values in this interval not displayed.   Recent Labs    02/27/22 1856 02/28/22 0403  LABPROT 20.1* 18.0*  INR 1.7* 1.5*      Assessment/Plan: -Variceal bleeding.  Status post EGD with band ligation 10/15 with 5 bands placement.  Patient with ongoing bleeding melanotic stool.  CT angio  negative for active bleeding.  IR has been consulted and there is a plan for TIPS placement during this admission. -Decompensated cirrhosis.  Prior alcohol use.  Quit 3 years ago. -History of NSAID use  Recommendations -------------------------- -Appreciate ICU intervention radiology support.   -awaiting TIPS placement  -Continue other supportive care with octreotide, PPI , Rocephin -follow H&H.  Transfuse if hemoglobin less than 7. -GI will follow  Otis Brace MD, [place] 03/01/2022, 11:34 AM  Contact #  [phone]

## 2022-03-02 ENCOUNTER — Inpatient Hospital Stay (HOSPITAL_COMMUNITY): Payer: Self-pay | Admitting: Certified Registered"

## 2022-03-02 ENCOUNTER — Inpatient Hospital Stay (HOSPITAL_COMMUNITY): Payer: Self-pay

## 2022-03-02 ENCOUNTER — Encounter (HOSPITAL_COMMUNITY)
Admission: EM | Disposition: A | Discharge status: Home / Self Care | Payer: Self-pay | Source: Home / Self Care | Attending: Internal Medicine

## 2022-03-02 ENCOUNTER — Other Ambulatory Visit: Payer: Self-pay | Admitting: Radiology

## 2022-03-02 ENCOUNTER — Encounter (HOSPITAL_COMMUNITY): Payer: Self-pay | Admitting: Internal Medicine

## 2022-03-02 DIAGNOSIS — E669 Obesity, unspecified: Secondary | ICD-10-CM

## 2022-03-02 DIAGNOSIS — I8511 Secondary esophageal varices with bleeding: Secondary | ICD-10-CM

## 2022-03-02 DIAGNOSIS — K7460 Unspecified cirrhosis of liver: Secondary | ICD-10-CM

## 2022-03-02 HISTORY — PX: RADIOLOGY WITH ANESTHESIA: SHX6223

## 2022-03-02 HISTORY — PX: IR TIPS: IMG2295

## 2022-03-02 HISTORY — PX: IR EMBO VENOUS NOT HEMORR HEMANG  INC GUIDE ROADMAPPING: IMG5447

## 2022-03-02 HISTORY — PX: IR US GUIDE VASC ACCESS RIGHT: IMG2390

## 2022-03-02 HISTORY — PX: IR IVUS EACH ADDITIONAL NON CORONARY VESSEL: IMG6086

## 2022-03-02 LAB — COMPREHENSIVE METABOLIC PANEL
ALT: 49 U/L — ABNORMAL HIGH (ref 0–44)
AST: 59 U/L — ABNORMAL HIGH (ref 15–41)
Albumin: 2.2 g/dL — ABNORMAL LOW (ref 3.5–5.0)
Alkaline Phosphatase: 39 U/L (ref 38–126)
Anion gap: 6 (ref 5–15)
BUN: 11 mg/dL (ref 6–20)
CO2: 24 mmol/L (ref 22–32)
Calcium: 7.6 mg/dL — ABNORMAL LOW (ref 8.9–10.3)
Chloride: 108 mmol/L (ref 98–111)
Creatinine, Ser: 0.9 mg/dL (ref 0.61–1.24)
GFR, Estimated: >60 mL/min (ref >60)
Glucose, Bld: 99 mg/dL (ref 70–99)
Potassium: 3.9 mmol/L (ref 3.5–5.1)
Sodium: 138 mmol/L (ref 135–145)
Total Bilirubin: 1.1 mg/dL (ref 0.3–1.2)
Total Protein: 3.9 g/dL — ABNORMAL LOW (ref 6.5–8.1)

## 2022-03-02 LAB — CBC WITH DIFFERENTIAL/PLATELET
Abs Immature Granulocytes: 0.07 10*3/uL (ref 0.00–0.07)
Basophils Absolute: 0 10*3/uL (ref 0.0–0.1)
Basophils Relative: 0 %
Eosinophils Absolute: 0.2 10*3/uL (ref 0.0–0.5)
Eosinophils Relative: 3 %
HCT: 23.2 % — ABNORMAL LOW (ref 39.0–52.0)
Hemoglobin: 8.5 g/dL — ABNORMAL LOW (ref 13.0–17.0)
Immature Granulocytes: 1 %
Lymphocytes Relative: 27 %
Lymphs Abs: 1.4 10*3/uL (ref 0.7–4.0)
MCH: 32.7 pg (ref 26.0–34.0)
MCHC: 36.6 g/dL — ABNORMAL HIGH (ref 30.0–36.0)
MCV: 89.2 fL (ref 80.0–100.0)
Monocytes Absolute: 0.5 10*3/uL (ref 0.1–1.0)
Monocytes Relative: 10 %
Neutro Abs: 3.1 10*3/uL (ref 1.7–7.7)
Neutrophils Relative %: 59 %
Platelets: 59 10*3/uL — ABNORMAL LOW (ref 150–400)
RBC: 2.6 MIL/uL — ABNORMAL LOW (ref 4.22–5.81)
RDW: 16.8 % — ABNORMAL HIGH (ref 11.5–15.5)
WBC: 5.3 10*3/uL (ref 4.0–10.5)
nRBC: 1.1 % — ABNORMAL HIGH (ref 0.0–0.2)

## 2022-03-02 LAB — GLUCOSE, CAPILLARY
Glucose-Capillary: 101 mg/dL — ABNORMAL HIGH (ref 70–99)
Glucose-Capillary: 129 mg/dL — ABNORMAL HIGH (ref 70–99)
Glucose-Capillary: 178 mg/dL — ABNORMAL HIGH (ref 70–99)
Glucose-Capillary: 88 mg/dL (ref 70–99)

## 2022-03-02 LAB — HEMOGLOBIN AND HEMATOCRIT, BLOOD
HCT: 28 % — ABNORMAL LOW (ref 39.0–52.0)
Hemoglobin: 9.8 g/dL — ABNORMAL LOW (ref 13.0–17.0)

## 2022-03-02 LAB — PROTIME-INR
INR: 1.5 — ABNORMAL HIGH (ref 0.8–1.2)
Prothrombin Time: 17.6 seconds — ABNORMAL HIGH (ref 11.4–15.2)

## 2022-03-02 LAB — PREPARE RBC (CROSSMATCH)

## 2022-03-02 LAB — MAGNESIUM: Magnesium: 1.8 mg/dL (ref 1.7–2.4)

## 2022-03-02 SURGERY — IR WITH ANESTHESIA
Anesthesia: General

## 2022-03-02 MED ORDER — CHLORHEXIDINE GLUCONATE 0.12 % MT SOLN
OROMUCOSAL | Status: AC
  Administered 2022-03-02: 15 mL via OROMUCOSAL
  Filled 2022-03-02: qty 15

## 2022-03-02 MED ORDER — DEXTROSE 5 % IV SOLN
INTRAVENOUS | Status: DC | PRN
  Administered 2022-03-02: 2 g via INTRAVENOUS

## 2022-03-02 MED ORDER — LIDOCAINE 2% (20 MG/ML) 5 ML SYRINGE
INTRAMUSCULAR | Status: DC | PRN
  Administered 2022-03-02: 100 mg via INTRAVENOUS

## 2022-03-02 MED ORDER — ROCURONIUM BROMIDE 10 MG/ML (PF) SYRINGE
PREFILLED_SYRINGE | INTRAVENOUS | Status: DC | PRN
  Administered 2022-03-02: 100 mg via INTRAVENOUS
  Administered 2022-03-02: 30 mg via INTRAVENOUS

## 2022-03-02 MED ORDER — FENTANYL CITRATE (PF) 100 MCG/2ML IJ SOLN
INTRAMUSCULAR | Status: AC
  Filled 2022-03-02: qty 2

## 2022-03-02 MED ORDER — MORPHINE SULFATE (PF) 2 MG/ML IV SOLN
2.0000 mg | Freq: Once | INTRAVENOUS | Status: AC
  Administered 2022-03-02: 2 mg via INTRAVENOUS
  Filled 2022-03-02: qty 1

## 2022-03-02 MED ORDER — IOHEXOL 300 MG/ML  SOLN
150.0000 mL | Freq: Once | INTRAMUSCULAR | Status: AC | PRN
  Administered 2022-03-02: 150 mL via INTRAVENOUS

## 2022-03-02 MED ORDER — FENTANYL CITRATE (PF) 100 MCG/2ML IJ SOLN
25.0000 ug | INTRAMUSCULAR | Status: DC | PRN
  Administered 2022-03-02: 25 ug via INTRAVENOUS

## 2022-03-02 MED ORDER — MEPERIDINE HCL 25 MG/ML IJ SOLN: 6.2500 mg | INTRAMUSCULAR | Status: DC | PRN

## 2022-03-02 MED ORDER — ACETAMINOPHEN 160 MG/5ML PO SOLN: 325.0000 mg | ORAL | Status: DC | PRN

## 2022-03-02 MED ORDER — SUGAMMADEX SODIUM 200 MG/2ML IV SOLN
INTRAVENOUS | Status: DC | PRN
  Administered 2022-03-02: 200 mg via INTRAVENOUS

## 2022-03-02 MED ORDER — SODIUM CHLORIDE 0.9 % IV SOLN: 2.0000 g | INTRAVENOUS | Status: DC

## 2022-03-02 MED ORDER — VASOPRESSIN 20 UNIT/ML IV SOLN
INTRAVENOUS | Status: AC
  Filled 2022-03-02: qty 1

## 2022-03-02 MED ORDER — LIDOCAINE HCL 1 % IJ SOLN
INTRAMUSCULAR | Status: AC
  Filled 2022-03-02: qty 20

## 2022-03-02 MED ORDER — FENTANYL CITRATE (PF) 250 MCG/5ML IJ SOLN
INTRAMUSCULAR | Status: DC | PRN
  Administered 2022-03-02 (×2): 50 ug via INTRAVENOUS

## 2022-03-02 MED ORDER — OXYCODONE HCL 5 MG PO TABS: 5.0000 mg | ORAL_TABLET | Freq: Once | ORAL | Status: DC | PRN

## 2022-03-02 MED ORDER — SODIUM CHLORIDE 0.9% IV SOLUTION: Freq: Once | INTRAVENOUS | Status: DC

## 2022-03-02 MED ORDER — SUCCINYLCHOLINE CHLORIDE 200 MG/10ML IV SOSY
PREFILLED_SYRINGE | INTRAVENOUS | Status: DC | PRN
  Administered 2022-03-02: 150 mg via INTRAVENOUS

## 2022-03-02 MED ORDER — CHLORHEXIDINE GLUCONATE 0.12 % MT SOLN: 15.0000 mL | Freq: Once | OROMUCOSAL | Status: AC

## 2022-03-02 MED ORDER — PHENYLEPHRINE HCL-NACL 20-0.9 MG/250ML-% IV SOLN
INTRAVENOUS | Status: DC | PRN
  Administered 2022-03-02: 50 ug/min via INTRAVENOUS

## 2022-03-02 MED ORDER — ONDANSETRON HCL 4 MG/2ML IJ SOLN: 4.0000 mg | Freq: Once | INTRAMUSCULAR | Status: DC | PRN

## 2022-03-02 MED ORDER — SODIUM CHLORIDE 0.9 % IV SOLN
INTRAVENOUS | Status: AC
  Filled 2022-03-02: qty 20

## 2022-03-02 MED ORDER — OXYCODONE HCL 5 MG/5ML PO SOLN: 5.0000 mg | Freq: Once | ORAL | Status: DC | PRN

## 2022-03-02 MED ORDER — SODIUM CHLORIDE 0.9 % IV SOLN: INTRAVENOUS | Status: DC

## 2022-03-02 MED ORDER — PROPOFOL 10 MG/ML IV BOLUS
INTRAVENOUS | Status: DC | PRN
  Administered 2022-03-02: 140 mg via INTRAVENOUS

## 2022-03-02 MED ORDER — DEXAMETHASONE SODIUM PHOSPHATE 10 MG/ML IJ SOLN
INTRAMUSCULAR | Status: DC | PRN
  Administered 2022-03-02: 10 mg via INTRAVENOUS

## 2022-03-02 MED ORDER — MIDAZOLAM HCL 2 MG/2ML IJ SOLN
INTRAMUSCULAR | Status: DC | PRN
  Administered 2022-03-02: 2 mg via INTRAVENOUS

## 2022-03-02 MED ORDER — ORAL CARE MOUTH RINSE: 15.0000 mL | Freq: Once | OROMUCOSAL | Status: AC

## 2022-03-02 MED ORDER — FENTANYL CITRATE PF 50 MCG/ML IJ SOSY
50.0000 ug | PREFILLED_SYRINGE | INTRAMUSCULAR | Status: DC | PRN
  Administered 2022-03-02 – 2022-03-03 (×3): 50 ug via INTRAVENOUS
  Filled 2022-03-02 (×3): qty 1

## 2022-03-02 MED ORDER — LACTATED RINGERS IV SOLN: INTRAVENOUS | Status: DC

## 2022-03-02 MED ORDER — ONDANSETRON HCL 4 MG/2ML IJ SOLN
INTRAMUSCULAR | Status: DC | PRN
  Administered 2022-03-02: 4 mg via INTRAVENOUS

## 2022-03-02 MED ORDER — ACETAMINOPHEN 325 MG PO TABS: 325.0000 mg | ORAL_TABLET | ORAL | Status: DC | PRN

## 2022-03-02 NOTE — Anesthesia Postprocedure Evaluation (Signed)
Anesthesia Post Note  Patient: Daniel Harmon  Procedure(s) Performed: TIPS     Patient location during evaluation: PACU Anesthesia Type: General Level of consciousness: awake and alert, oriented and patient cooperative Pain management: pain level controlled Vital Signs Assessment: post-procedure vital signs reviewed and stable Respiratory status: spontaneous breathing, nonlabored ventilation and respiratory function stable Cardiovascular status: blood pressure returned to baseline and stable Postop Assessment: no apparent nausea or vomiting Anesthetic complications: no     Last Vitals:  Vitals:   03/02/22 1715 03/02/22 1730  BP: 114/72 (!) 119/92  Pulse: 65 74  Resp: (!) 21 (!) 26  Temp:    SpO2: 96% 95%    Last Pain:  Vitals:   03/02/22 1651  TempSrc:   PainSc: [place]

## 2022-03-02 NOTE — Progress Notes (Signed)
-   Patient not in the room.  Getting TIPS placement by IR today.  We will follow-up tomorrow  Otis Brace MD, [place] 03/02/2022, 2:34 PM  Contact #  [phone]

## 2022-03-02 NOTE — Anesthesia Procedure Notes (Signed)
Arterial Line Insertion Start/End10/18/2023 12:00 PM, 03/02/2022 12:10 PM Performed by: Lavell Luster, CRNA  Patient location: Pre-op. Preanesthetic checklist: patient identified, IV checked, site marked, risks and benefits discussed, surgical consent, monitors and equipment checked, pre-op evaluation and timeout performed Lidocaine 1% used for infiltration Left, radial was placed Catheter size: 20 G Hand hygiene performed , maximum sterile barriers used  and Seldinger technique used Allen's test indicative of satisfactory collateral circulation Attempts: 2 Procedure performed without using ultrasound guided technique. Following insertion, Biopatch and dressing applied. Post procedure assessment: normal  Patient tolerated the procedure well with no immediate complications.

## 2022-03-02 NOTE — Progress Notes (Signed)
Interventional Radiology Brief Note:  Patient assessed at bedside alongside Dr. Pascal Lux s/p TIPS procedure this afternoon in IR.  Patient with right-sided abdominal pain as expected post-procedure. Family updated on procedure and recovery expectations.  All questions answered by Dr. Pascal Lux.   IR to follow-up tomorrow.   Brynda Greathouse, MS RD PA-C

## 2022-03-02 NOTE — Procedures (Signed)
Pre procedural Dx: Cirrhosis; Upper GI variceal bleeding Post procedural Dx: Same  Technically successful creation of a TIPS with reduction of portosystemic gradient from 15 mmHg to 6 mmHg. Successful percutaneous coil embolization of gastric varices.  Access: R IJ and R CFV; hemostasis achieved with manual compression.   EBL: Trace Complications: None immediate  Ronny Bacon, MD Pager #: [phone]

## 2022-03-02 NOTE — Sedation Documentation (Addendum)
Transported to PACU-6 via bed with CRNA and RN x2. NAD noted. Bedside report given to PACU RN

## 2022-03-02 NOTE — H&P (Signed)
Patient Status: [facility] - In-pt  Assessment and Plan: Hematemesis Patient with recent diagnosis of cirrhosis, remote history of heavy EtOH use presented to [facility] 02/27/22 with hematemesis.  He underwent large esophgeal variceal banding with GI the same day, however subsequently had additional episode of large BRBPR with associated hypotension requiring pressors.  IR consulted for TIPS procedure.  He was transferred from [facility] to [facility] and plan made to proceed with inpatient TIPS with anesthesia today, 10/18.  He has weaned from pressors.  VSS. Hgb 8.5. Patient assessed at bedside alongside family.  They have several questions which are answered.  Daniel Harmon is aware of the goals of the procedure.  He is agreeable to proceed.   Risks and benefits of TIPS, BRTO and/or additional variceal embolization were discussed with the patient and/or the patient's family including, but not limited to, infection, bleeding, damage to adjacent structures, worsening hepatic and/or cardiac function, worsening and/or the development of altered mental status/encephalopathy, non-target embolization and death.   This interventional procedure involves the use of X-rays and because of the nature of the planned procedure, it is possible that we will have prolonged use of X-ray fluoroscopy.  Potential radiation risks to you include (but are not limited to) the following: - A slightly elevated risk for cancer several years later in life. This risk is typically less than 0.5% percent. This risk is low in comparison to the normal incidence of human cancer, which is 33% for women and 50% for men according to the [facility]. - Radiation induced injury can include skin redness, resembling a rash, tissue breakdown / ulcers and hair loss (which can be temporary or permanent).   The likelihood of either of these occurring depends on the difficulty of the procedure and whether you are sensitive to radiation due to  previous procedures, disease, or genetic conditions.   IF your procedure requires a prolonged use of radiation, you will be notified and given written instructions for further action.  It is your responsibility to monitor the irradiated area for the 2 weeks following the procedure and to notify your physician if you are concerned that you have suffered a radiation induced injury.    All of the patient's questions were answered, patient is agreeable to proceed.  Consent signed and in chart.  ______________________________________________________________________   History of Present Illness: Daniel Harmon is a 51 y.o. male with recent diagnosis of cirrhosis who presented to [facility] 02/27/22 with hematemesis.  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  CTA Abdomen Pelvis showed no acute bleed but sequelae of portal hypertension.  IR consulted for TIPS for acute GI bleeding.    Allergies and medications reviewed.   Review of Systems: A 12 point ROS discussed and pertinent positives are indicated in the HPI above.  All other systems are negative.  Review of Systems  Constitutional:  Negative for fatigue and fever.  Respiratory:  Negative for cough and shortness of breath.   Cardiovascular:  Negative for chest pain.  Gastrointestinal:  Positive for blood in stool and vomiting. Negative for abdominal pain.  Musculoskeletal:  Negative for back pain.  Psychiatric/Behavioral:  Negative for behavioral problems and confusion.     Vital Signs: BP 134/71   Pulse 66   Temp 98.3 F (36.8 C) (Oral)   Resp 17   Wt 229 lb 4.5 oz (104 kg)   SpO2 97%   BMI 36.85 kg/m   Physical  Exam Vitals and nursing note reviewed.  Constitutional:      General: He is not in acute distress.    Appearance: Normal appearance. He is not ill-appearing.  HENT:     Mouth/Throat:     Mouth: Mucous membranes are moist.     Pharynx: Oropharynx is  clear.  Cardiovascular:     Rate and Rhythm: Normal rate and regular rhythm.  Pulmonary:     Effort: Pulmonary effort is normal.     Breath sounds: Normal breath sounds.  Abdominal:     General: Abdomen is flat. There is no distension.     Palpations: Abdomen is soft.     Tenderness: There is no abdominal tenderness.  Skin:    General: Skin is warm and dry.  Neurological:     General: No focal deficit present.     Mental Status: He is alert and oriented to person, place, and time. Mental status is at baseline.  Psychiatric:        Mood and Affect: Mood normal.        Behavior: Behavior normal.        Thought Content: Thought content normal.        Judgment: Judgment normal.      Imaging reviewed.   Labs:  COAGS: Recent Labs    02/27/22 0757 02/27/22 1856 02/28/22 0403 03/02/22 0818  INR 1.7* 1.7* 1.5* 1.5*  APTT 30 24  --   --     BMP: Recent Labs    02/27/22 1856 02/28/22 0403 03/01/22 0340 03/02/22 0556  NA 142 142 143 138  K 4.0 3.9 3.6 3.9  CL 115* 113* 113* 108  CO2 20* 22 26 24   GLUCOSE 193* 132* 115* 99  BUN 36* 28* 20 11  CALCIUM 6.9* 7.3* 7.3* 7.6*  CREATININE 1.18 1.07 0.98 0.90  GFRNONAA >60 >60 >60 >60       Electronically Signed: Docia Barrier, PA 03/02/2022, 10:03 AM   I spent a total of 15 minutes in face to face in clinical consultation, greater than 50% of which was counseling/coordinating care for venous access.

## 2022-03-02 NOTE — Anesthesia Procedure Notes (Signed)
Procedure Name: Intubation Date/Time: 03/02/2022 1:02 PM  Performed by: Lavell Luster, CRNAPre-anesthesia Checklist: Patient identified, Emergency Drugs available, Suction available, Patient being monitored and Timeout performed Patient Re-evaluated:Patient Re-evaluated prior to induction Oxygen Delivery Method: Circle system utilized Preoxygenation: Pre-oxygenation with 100% oxygen Induction Type: IV induction Ventilation: Mask ventilation without difficulty Laryngoscope Size: Mac, 4 and Glidescope Grade View: Grade I Tube type: Oral Tube size: 7.5 mm Number of attempts: 1 Airway Equipment and Method: Stylet Placement Confirmation: positive ETCO2, breath sounds checked- equal and bilateral and ETT inserted through vocal cords under direct vision Secured at: 22 cm Tube secured with: Tape Dental Injury: Teeth and Oropharynx as per pre-operative assessment  Difficulty Due To: Difficulty was anticipated, Difficult Airway-  due to edematous airway, Difficult Airway- due to limited oral opening, Difficult Airway- due to dentition and Difficult Airway- due to reduced neck mobility

## 2022-03-02 NOTE — Anesthesia Preprocedure Evaluation (Addendum)
Anesthesia Evaluation  Patient identified by MRN, date of birth, ID band Patient awake    Reviewed: Allergy & Precautions, H&P , NPO status , Patient's Chart, lab work & pertinent test results  Airway Mallampati: III  TM Distance: >3 FB Neck ROM: Full    Dental  (+) Partial Upper, Dental Advisory Given, Poor Dentition, Chipped, Missing   Pulmonary former smoker   Pulmonary exam normal breath sounds clear to auscultation       Cardiovascular negative cardio ROS Normal cardiovascular exam Rhythm:Regular Rate:Normal  ECHO 10/23   1. Left ventricular ejection fraction, by estimation, is >75%. Left  ventricular ejection fraction by 3D volume is 77 %. The left ventricle has  hyperdynamic function. The left ventricle has no regional wall motion  abnormalities. There is mild concentric  left ventricular hypertrophy. Left ventricular diastolic parameters are  consistent with Grade I diastolic dysfunction (impaired relaxation).   2. Right ventricular systolic function is normal. The right ventricular  size is normal. There is normal pulmonary artery systolic pressure.   3. The mitral valve is normal in structure. Trivial mitral valve  regurgitation.   4. The aortic valve is tricuspid. Aortic valve regurgitation is not  visualized. No aortic stenosis is present.   5. Aortic dilatation noted. There is borderline dilatation of the aortic  root, measuring 37 mm.   6. The inferior vena cava is normal in size with greater than 50%  respiratory variability, suggesting right atrial pressure of 3 mmHg.      Neuro/Psych negative neurological ROS  negative psych ROS   GI/Hepatic ,,,(+) Cirrhosis     substance abuse  alcohol useStopped all ETOH use 2 years ago per Hx Hx/o "liver disease" unspecified- per Hx Hematemesis Melena   Endo/Other  Obesity Hyperglycemia  Renal/GU negative Renal ROS  negative genitourinary    Musculoskeletal negative musculoskeletal ROS (+)    Abdominal  (+) + obese  Peds  Hematology  (+) Blood dyscrasia, anemia Thrombocytopenia   Anesthesia Other Findings   Reproductive/Obstetrics                           Anesthesia Physical Anesthesia Plan  ASA: 4  Anesthesia Plan: General   Post-op Pain Management: Minimal or no pain anticipated   Induction: Intravenous  PONV Risk Score and Plan: 1 and Treatment may vary due to age or medical condition and Ondansetron  Airway Management Planned: Oral ETT  Additional Equipment: Arterial line  Intra-op Plan:   Post-operative Plan: Extubation in OR  Informed Consent: I have reviewed the patients History and Physical, chart, labs and discussed the procedure including the risks, benefits and alternatives for the proposed anesthesia with the patient or authorized representative who has indicated his/her understanding and acceptance.     Dental advisory given  Plan Discussed with: CRNA and Anesthesiologist  Anesthesia Plan Comments: (  )      Anesthesia Quick Evaluation

## 2022-03-02 NOTE — Progress Notes (Signed)
NAME:  Daniel Harmon, MRN:  [id number], DOB:  Jun 02, 1970, LOS: 3 ADMISSION DATE:  02/27/2022, CONSULTATION DATE:  02/27/22 REFERRING MD:  Dr. Marylyn Ishihara, CHIEF COMPLAINT:  Hemorrhagic Shock   History of Present Illness:  51 year old male with a history of cirrhosis.  He presented on 02/27/2022 with hematemesis that was of abrupt onset no recent ethanol use according to chart review but has admitted to using 14 standard drinks of alcohol per week.  Last drink not known.  Denies any drug use.  He was using nonsteroidal anti-inflammatory drugs.  He is a previous smoker having quit 5 years ago.  On 02/27/2022 underwent upper endoscopy by Dr. Randel Pigg of [facility] with variceal banding.  History of bleeding esophageal varices in the lower one third of esophagus.  The stomach with fresh blood and large clots.  Duodenal bulb with fresh blood but no active bleeding.  Second portion of the duodenum was unremarkable.  Status post esophageal variceal band ligation x5 minutes with good hemostasis.  Started on IV PPI and IV octreotide and transferred to the floor.  Subsequently around 6 PM 02/27/2022 patient went to the bathroom and then had a large bloody bowel movement that according to the wife mostly described as melena.  Subsequent to this patient had a syncopal episode associated with significant hypotension.  Transferred to the ICU.  Initially started on  Levophed 10 mcg/min through peripheral intravenous line and systolic blood pressure 85 systolic.  CCM consulted and pt transferred to [facility] from [facility].   Initial labs with hgb 11.1, normal Cr but elevated BUN. Negative hepatitis panel.  Pt given 2 units yesterday of pRBC.    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  Review of Systems:   Negative unless stated in the subjective.  Objective: febrile, normotensive, satting well on RA.   Blood pressure 131/64, pulse 72, temperature 98.3 F (36.8 C), temperature source Oral, resp. rate 19, weight 104 kg, SpO2 97 %.        Intake/Output Summary (Last 24 hours) at 03/02/2022 0741 Last data filed at 03/02/2022 0000 Gross per 24 hour  Intake 2788.93 ml  Output 1500 ml  Net 1288.93 ml    Filed Weights   02/28/22 0104 03/02/22 0500  Weight: 99.8 kg 104 kg   Intake: 2.8 L Output: 1.5 L Stool: 1x  Examination: General: NAD HENT: NCAT, MMM, anicteric sclera.  Lungs: No distress.  Clear to auscultation bilaterally Cardiovascular: Tachycardic normal heart sounds Abdomen: Soft, slight TTP.  No obvious ascites Extremities: No asymmetry. SCD in place Neuro: alert and oriented x4 GU: No foley.    Consults  IR, GI Resolved Hospital Problem list    Assessment & Plan:  ABLA 2/2 to UGI S/p 7 units of pRBCs.  Hgb 8.5 this am. One BM yesterday suggestive of decreased bleeding given cathartic nature of the blood. Plan for TIPS today by IR.  -Continue to trend hgb q8hrs.    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 MELD 14 at admit Suspected alcoholic 1st episode of GI Variceal Bleed - s/p banding 02/27/22.   Hx of alcohol use but none since last 3 years.  On PPI BID, Octreotide gtt, and Rocephin 2 g qd.  Plan for TIPS by IR.  -Continue PPI, Octreotide, and Rocephin. Plan for 5 days of octreotide and rocephin.  -Follow up IR with planned TIPS. NPO after midnight.    Hypocalcemia Pseudohypocalcemia. Calcium 7.6, corrected 9.0 s/p 2 g yesterday.  -CTM   Hyperglycemia of Critical Illness On SSI. Goal 140-180. Last CBG is 111. A1c 5.1.   Best Practice (right click and "Reselect all SmartList  Selections" daily)  Diet/type: CLD DVT prophylaxis: SCD GI prophylaxis: PPI Lines: N/A Foley:  N/A Continuous: Octreotide, LR  Code Status:  full code Last date of multidisciplinary goals of care discussion [02/28/22] Labs: CBC with normal WBC, Hgb 8.5, Plt 59k. BMP with normal K and normal Na. Calcium low at 7.6 but corrected 9.0.  Slight transaminase elevation. LA resolved. Mag 1.8. CBG 101.  CBC: Recent Labs  Lab 02/27/22 0447 02/27/22 1020 02/28/22 0403 02/28/22 1226 02/28/22 2126 03/01/22 0340 03/01/22 1039 03/01/22 2004 03/02/22 0556  WBC 10.8*  --  11.9*  --   --  9.5  --   --  5.3  NEUTROABS 8.3*  --   --   --   --   --   --   --  3.1  HGB 11.1*   < > 9.1*   < > 7.2* 7.3* 7.2* 8.4* 8.5*  HCT 32.1*   < > 25.5*   < > 20.5* 21.2* 20.9* 23.7* 23.2*  MCV 97.0  --  89.8  --   --  90.6  --   --  89.2  PLT 138*  --  97*  --   --  65*  --   --  59*   < > = values in this interval not displayed.    Basic Metabolic Panel: Recent Labs  Lab 02/27/22 0445 02/27/22 1856 02/28/22 0403 03/01/22 0340 03/02/22 0556  NA 141 142 142 143 138  K 4.0 4.0 3.9 3.6 3.9  CL 112* 115* 113* 113* 108  CO2 22 20* 22 26 24   GLUCOSE 171* 193* 132* 115* 99  BUN 45* 36* 28* 20 11  CREATININE 0.99 1.18 1.07 0.98 0.90  CALCIUM 8.7* 6.9* 7.3* 7.3* 7.6*  MG  --   --  1.8  --  1.8    GFR: CrCl cannot be calculated (Unknown ideal weight.). Recent Labs  Lab 02/27/22 0447 02/28/22 0403 02/28/22 0930 02/28/22 1713 02/28/22 2126 03/01/22 0025 03/01/22 0340 03/02/22 0556  WBC 10.8* 11.9*  --   --   --   --  9.5 5.3  LATICACIDVEN  --  2.2*   < > 2.8* 3.0* 2.7* 1.5  --    < > = values in this interval not displayed.    Liver Function Tests: Recent Labs  Lab 02/27/22 0445 02/27/22 1658 03/02/22 0556  AST 34  --  59*  ALT 33  --  49*  ALKPHOS 66  --  39  BILITOT 1.6* 1.2 1.1  PROT 6.3*  --  3.9*  ALBUMIN 3.5  --  2.2*    No results for input(s): "LIPASE", "AMYLASE" in the last  168 hours. No results for input(s): "AMMONIA" in the last 168 hours. ABG No results found for: "PHART", "PCO2ART", "PO2ART", "HCO3", "TCO2", "ACIDBASEDEF", "O2SAT"  Coagulation Profile: Recent Labs  Lab 02/27/22 0757 02/27/22 1856 02/28/22 0403  INR 1.7* 1.7* 1.5*    Cardiac Enzymes: No results for input(s): "CKTOTAL", "CKMB", "CKMBINDEX", "TROPONINI" in the last 168  hours. HbA1C: Hgb A1c MFr Bld  Date/Time Value Ref Range Status  02/27/2022 04:58 PM 5.1 4.8 - 5.6 % Final    Comment:    (NOTE) Pre diabetes:          5.7%-6.4%  Diabetes:              >6.4%  Glycemic control for   <7.0% adults with diabetes    CBG: Recent Labs  Lab 03/01/22 1530 03/01/22 1918 03/01/22 2326 03/02/22 0324 03/02/22 0720  GLUCAP 121* 92 102* 88 101*    Past Medical History:  He,  has a past medical history of Cirrhosis ([place]).  Surgical History:   Past Surgical History:  Procedure Laterality Date   ESOPHAGEAL BANDING  02/27/2022   Procedure: ESOPHAGEAL BANDING;  Surgeon: Loney Laurence, DO;  Location: [facility] ENDOSCOPY;  Service: Gastroenterology;;   ESOPHAGOGASTRODUODENOSCOPY N/A 02/27/2022   Procedure: ESOPHAGOGASTRODUODENOSCOPY (EGD);  Surgeon: Loney Laurence, DO;  Location: [facility];  Service: Gastroenterology;  Laterality: N/A;   NO PAST SURGERIES      Social History:   reports that he quit smoking about 5 years ago. His smoking use included cigarettes. He has never used smokeless tobacco. He reports current alcohol use of about 14.0 standard drinks of alcohol per week. He reports that he does not use drugs.  Family History:  His family history includes Alcoholism in his father.  Allergies No Known Allergies  Home Medications  Prior to Admission medications   Medication Sig Start Date End Date Taking? Authorizing Provider  Creatine POWD Take 1 Scoop by mouth daily as needed (after work out).   Yes [provider]  Cyanocobalamin (B-12 PO) Take 1 tablet by  mouth daily.   Yes [provider]  Ibuprofen-diphenhydrAMINE Cit (ADVIL PM PO) Take 1 tablet by mouth at bedtime as needed (sleep).   Yes [provider]  MELATONIN PO Take 1 tablet by mouth at bedtime as needed (sleep).   Yes [provider]  OVER THE COUNTER MEDICATION Take 1 capsule by mouth daily. OTC Herbal Detox   Yes [provider]    Idamae Schuller, MD Tillie Rung. [facility] Internal Medicine Residency, PGY-2

## 2022-03-02 NOTE — Progress Notes (Signed)
   Pt is scheduled for TIPS in IR today with anesthesia Please see orders.

## 2022-03-02 NOTE — Transfer of Care (Signed)
Immediate Anesthesia Transfer of Care Note  Patient: Daniel Harmon  Procedure(s) Performed: TIPS  Patient Location: PACU  Anesthesia Type:General  Level of Consciousness: awake, alert , and oriented  Airway & Oxygen Therapy: Patient Spontanous Breathing and Patient connected to nasal cannula oxygen  Post-op Assessment: Report given to RN and Post -op Vital signs reviewed and stable  Post vital signs: Reviewed and stable  Last Vitals:  Vitals Value Taken Time  BP 118/67 03/02/22 1542  Temp    Pulse 70 03/02/22 1545  Resp 15 03/02/22 1545  SpO2 92 % 03/02/22 1545  Vitals shown include unvalidated device data.  Last Pain:  Vitals:   03/02/22 1140  TempSrc: Oral  PainSc: 0-No pain         Complications:  Encounter Notable Events  Notable Event Outcome Phase Comment  Difficult to intubate - expected  Intraprocedure Filed from anesthesia note documentation.

## 2022-03-03 ENCOUNTER — Encounter (HOSPITAL_COMMUNITY): Payer: Self-pay

## 2022-03-03 HISTORY — PX: IR US GUIDE VASC ACCESS RIGHT: IMG2390

## 2022-03-03 LAB — BPAM RBC
Blood Product Expiration Date: 202311112359
Blood Product Expiration Date: 202311172359
Blood Product Expiration Date: 202311172359
Blood Product Expiration Date: 202311172359
Blood Product Expiration Date: 202311172359
Blood Product Expiration Date: 202311172359
ISSUE DATE / TIME: 202310150531
ISSUE DATE / TIME: 202310150531
ISSUE DATE / TIME: 202310151824
ISSUE DATE / TIME: 202310152123
Unit Type and Rh: 5100
Unit Type and Rh: 5100
Unit Type and Rh: 5100
Unit Type and Rh: 5100
Unit Type and Rh: 9500
Unit Type and Rh: 9500

## 2022-03-03 LAB — COMPREHENSIVE METABOLIC PANEL
ALT: 65 U/L — ABNORMAL HIGH (ref 0–44)
AST: 62 U/L — ABNORMAL HIGH (ref 15–41)
Albumin: 2.5 g/dL — ABNORMAL LOW (ref 3.5–5.0)
Alkaline Phosphatase: 52 U/L (ref 38–126)
Anion gap: 11 (ref 5–15)
BUN: 9 mg/dL (ref 6–20)
CO2: 21 mmol/L — ABNORMAL LOW (ref 22–32)
Calcium: 7.5 mg/dL — ABNORMAL LOW (ref 8.9–10.3)
Chloride: 103 mmol/L (ref 98–111)
Creatinine, Ser: 0.94 mg/dL (ref 0.61–1.24)
GFR, Estimated: >60 mL/min (ref >60)
Glucose, Bld: 142 mg/dL — ABNORMAL HIGH (ref 70–99)
Potassium: 4 mmol/L (ref 3.5–5.1)
Sodium: 135 mmol/L (ref 135–145)
Total Bilirubin: 1.3 mg/dL — ABNORMAL HIGH (ref 0.3–1.2)
Total Protein: 4.7 g/dL — ABNORMAL LOW (ref 6.5–8.1)

## 2022-03-03 LAB — OCCULT BLOOD GASTRIC / DUODENUM (SPECIMEN CUP)
Occult Blood, Gastric: POSITIVE — AB
pH, Gastric: 7

## 2022-03-03 LAB — PREPARE PLATELET PHERESIS
Unit division: 0
Unit division: 0

## 2022-03-03 LAB — GLUCOSE, CAPILLARY
Glucose-Capillary: 104 mg/dL — ABNORMAL HIGH (ref 70–99)
Glucose-Capillary: 106 mg/dL — ABNORMAL HIGH (ref 70–99)
Glucose-Capillary: 122 mg/dL — ABNORMAL HIGH (ref 70–99)
Glucose-Capillary: 128 mg/dL — ABNORMAL HIGH (ref 70–99)
Glucose-Capillary: 129 mg/dL — ABNORMAL HIGH (ref 70–99)
Glucose-Capillary: 93 mg/dL (ref 70–99)

## 2022-03-03 LAB — CBC WITH DIFFERENTIAL/PLATELET
Abs Immature Granulocytes: 0.16 10*3/uL — ABNORMAL HIGH (ref 0.00–0.07)
Basophils Absolute: 0 10*3/uL (ref 0.0–0.1)
Basophils Relative: 0 %
Eosinophils Absolute: 0 10*3/uL (ref 0.0–0.5)
Eosinophils Relative: 0 %
HCT: 27.1 % — ABNORMAL LOW (ref 39.0–52.0)
Hemoglobin: 9.8 g/dL — ABNORMAL LOW (ref 13.0–17.0)
Immature Granulocytes: 2 %
Lymphocytes Relative: 8 %
Lymphs Abs: 0.8 10*3/uL (ref 0.7–4.0)
MCH: 31.7 pg (ref 26.0–34.0)
MCHC: 36.2 g/dL — ABNORMAL HIGH (ref 30.0–36.0)
MCV: 87.7 fL (ref 80.0–100.0)
Monocytes Absolute: 0.4 10*3/uL (ref 0.1–1.0)
Monocytes Relative: 5 %
Neutro Abs: 8 10*3/uL — ABNORMAL HIGH (ref 1.7–7.7)
Neutrophils Relative %: 85 %
Platelets: 101 10*3/uL — ABNORMAL LOW (ref 150–400)
RBC: 3.09 MIL/uL — ABNORMAL LOW (ref 4.22–5.81)
RDW: 16.6 % — ABNORMAL HIGH (ref 11.5–15.5)
WBC: 9.3 10*3/uL (ref 4.0–10.5)
nRBC: 0.2 % (ref 0.0–0.2)

## 2022-03-03 LAB — TYPE AND SCREEN
ABO/RH(D): O POS
Antibody Screen: NEGATIVE
Unit division: 0
Unit division: 0
Unit division: 0
Unit division: 0
Unit division: 0
Unit division: 0

## 2022-03-03 LAB — BPAM PLATELET PHERESIS
Blood Product Expiration Date: 202310212359
Blood Product Expiration Date: 202310212359
ISSUE DATE / TIME: 202310181304
ISSUE DATE / TIME: 202310181639
Unit Type and Rh: 6200
Unit Type and Rh: 7300

## 2022-03-03 LAB — POCT I-STAT 7, (LYTES, BLD GAS, ICA,H+H)
Acid-base deficit: 3 mmol/L — ABNORMAL HIGH (ref 0.0–2.0)
Bicarbonate: 23.8 mmol/L (ref 20.0–28.0)
Calcium, Ion: 1.06 mmol/L — ABNORMAL LOW (ref 1.15–1.40)
HCT: 22 % — ABNORMAL LOW (ref 39.0–52.0)
Hemoglobin: 7.5 g/dL — ABNORMAL LOW (ref 13.0–17.0)
O2 Saturation: 99 %
Patient temperature: 36.3
Potassium: 3.7 mmol/L (ref 3.5–5.1)
Sodium: 138 mmol/L (ref 135–145)
TCO2: 25 mmol/L (ref 22–32)
pCO2 arterial: 47.1 mmHg (ref 32–48)
pH, Arterial: 7.308 — ABNORMAL LOW (ref 7.35–7.45)
pO2, Arterial: 131 mmHg — ABNORMAL HIGH (ref 83–108)

## 2022-03-03 LAB — MAGNESIUM: Magnesium: 1.8 mg/dL (ref 1.7–2.4)

## 2022-03-03 MED ORDER — LACTULOSE 10 GM/15ML PO SOLN
20.0000 g | Freq: Three times a day (TID) | ORAL | Status: DC
  Administered 2022-03-03 – 2022-03-04 (×4): 20 g via ORAL
  Filled 2022-03-03 (×4): qty 30

## 2022-03-03 MED ORDER — MAGNESIUM SULFATE 2 GM/50ML IV SOLN
2.0000 g | Freq: Once | INTRAVENOUS | Status: AC
  Administered 2022-03-03: 2 g via INTRAVENOUS
  Filled 2022-03-03: qty 50

## 2022-03-03 MED ORDER — RIFAXIMIN 550 MG PO TABS
550.0000 mg | ORAL_TABLET | Freq: Two times a day (BID) | ORAL | Status: DC
  Administered 2022-03-03 – 2022-03-05 (×5): 550 mg via ORAL
  Filled 2022-03-03 (×5): qty 1

## 2022-03-03 NOTE — Progress Notes (Signed)
Referring Physician(s): Dr. Ernest Mallick  Supervising Physician: Corrie Mckusick  Patient Status:  [facility] - In-pt  Chief Complaint: GI bleed s/p TIPS 03/02/22 by Dr. Pascal Lux.   Subjective: Patient s/p TIPS procedure yesterday.  Assessed at bedside this AM.  Complains of right-sided abdominal pain well-controlled with pain medication.  Mild tenderness with palpation.  Procedure sites intact.  Planning to transfer out of unit today.   Allergies: Patient has no known allergies.  Medications: Prior to Admission medications   Medication Sig Start Date End Date Taking? Authorizing Provider  Creatine POWD Take 1 Scoop by mouth daily as needed (after work out).   Yes [provider]  Cyanocobalamin (B-12 PO) Take 1 tablet by mouth daily.   Yes [provider]  Ibuprofen-diphenhydrAMINE Cit (ADVIL PM PO) Take 1 tablet by mouth at bedtime as needed (sleep).   Yes [provider]  MELATONIN PO Take 1 tablet by mouth at bedtime as needed (sleep).   Yes [provider]  OVER THE COUNTER MEDICATION Take 1 capsule by mouth daily. OTC Herbal Detox   Yes [provider]     Vital Signs: BP 127/72 (BP Location: Right Arm)   Pulse 65   Temp 98.1 F (36.7 C) (Oral)   Resp 19   Wt 229 lb 4.5 oz (104 kg)   SpO2 95%   BMI 36.85 kg/m   Physical Exam Vitals and nursing note reviewed.   NAD, alert. Resting comfortably in bed.  Abdomen: moderate tenderness to palpation in the RUQ.  Skin: R IJ intact.  Skin site c/d/I.  R femoral procedure site intact. Dressing clean and dry.   Imaging: IR Tips  Result Date: 03/02/2022 CLINICAL DATA:  History of cirrhosis, admitted with upper GI variceal bleeding, which has persisted despite endoscopy with variceal banding. Patient presents now for image guided TIPS creation with potential variceal embolization. EXAM: 1. Ultrasound-guided access of the right internal jugular vein 2. Ultrasound-guided access of the  right common femoral vein 3. Hepatic venogram 4. Intravascular ultrasound 5. Catheterization of the portal vein 6. Portal venous and central manometry 7. Portal venogram 8. Creation of a transhepatic portal vein to hepatic vein shunt 9. Fluoroscopic guided percutaneous coil embolization of gastric varices x2 MEDICATIONS: As antibiotic prophylaxis, Rocephin 2 gm IV was ordered pre-procedure and administered intravenously within one hour of incision. ANESTHESIA/SEDATION: General - as administered by the Anesthesia department CONTRAST:  150 cc ML Omnipaque 300, intravenous COMPARISON:  BRT0 protocol CT scan-02/27/2022 FLUOROSCOPY TIME:  28 minutes, 6 seconds (8,889 mGy) COMPLICATIONS: None immediate. PROCEDURE: Informed written consent was obtained from the patient and the patient's family (via the use of a Forensic scientist) after a thorough discussion of the procedural risks, benefits and alternatives. All questions were addressed. Maximal Sterile Barrier Technique was utilized including caps, mask, sterile gowns, sterile gloves, sterile drape, hand hygiene and skin antiseptic. A timeout was performed prior to the initiation of the procedure. A preliminary ultrasound of the right groin was performed and demonstrates a patent right common femoral vein. A permanent ultrasound image was recorded. Using a combination of fluoroscopy and ultrasound, an access site was determined. A small dermatotomy was made at the planned puncture site. Using ultrasound guidance, access into the right common femoral vein was obtained with visualization of needle entry into the vessel using a standard micropuncture technique. A wire was advanced into the IVC insert all fascial dilation performed. An 8 [place], 35 cm vascular sheath was placed into the  IVC. Through this access site, an 51 [place] ICE catheter was advanced with ease under fluoroscopic guidance to the level of the intrahepatic inferior vena cava. A preliminary  ultrasound of the right neck was performed and demonstrates a patent internal jugular vein. A permanent ultrasound image was recorded. Using a combination of fluoroscopy and ultrasound, an access site was determined. A small dermatotomy was made at the planned puncture site. Using ultrasound guidance, access into the right internal jugular vein was obtained with visualization of needle entry into the vessel using a standard micropuncture technique. A wire was advanced into the IVC and serial fascial dilation performed. A 10 French tips sheath was placed into the internal jugular vein and advanced to the IVC. The jugular sheath was retracted into the right atrium and manometry was performed. A 5 French angled tip catheter was then directed into the right hepatic vein. Hepatic venogram was performed. These images demonstrated patent hepatic vein with no stenosis. The catheter was advanced to a wedge portion of the a patent vein over which the 10 French sheath was advanced into the right hepatic vein. Using ICE ultrasound visualization the catheter as right hepatic vein as well as the portal anatomy was defined. A planned exit site from the hepatic vein and puncture site from the portal vein was placed into a single sonographic plane. Under direct ultrasound visualization, the ScorpionX needle was advanced into the central aspect of the posterior division of the right portal vein. A Glidewire Advantage was then advanced into the splenic vein. A 5 French marking pigtail catheter was then advanced over the wire into the main portal vein and wire removed. Portal venogram was performed which demonstrated a patent portal vein. Portal manometry was then performed. The tract was then dilated to 8 mm with an 8 mm x 8 cm Athletis balloon. A 8-10 mm by 7 + 2 cm of Viatorr endograft was placed. This was ultimately dilated to 8 mm. After placement of the shunt, right atrial and portal pressures were repeated. Completion portal  venogram demonstrates a patent TIPS endograft. Utilizing a C2 catheter, the posterior gastric vein was catheterized and contrast injection was performed demonstrating contribution to multiple hypertrophied varices. As such, with the use of a Navicross catheter, multiple overlapping 6 mm diameter [place] 0.035 Coils were deployed to near the vessel's confluence with the splenic vein. Next, the C2 catheter was utilized to select the left gastric vein and contrast injection was performed demonstrating contribution to multiple hypertrophied varices. As such, multiple overlapping 10 mm diameter Azur 0.035 Coils were deployed to near the vessel's confluence with the splenic vein. The C2 catheter was then exchanged over the [place] catheter for a measuring pigtail catheter and completion splenic and portal venograms were performed The catheters and sheath were removed and manual compression was applied to the right internal jugular and right common femoral venous access sites until hemostasis was achieved. The patient was transferred to the PACU in stable condition. Pre-TIPS Mean Pressures (mmHg): Right atrium: 20 Portal vein: 35 Portosystemic gradient: 15 Post-TIPS Mean Pressures (mmHg): Right atrium: 35 Portal vein: 28 Portosystemic gradient: 6 IMPRESSION: 1. Successful transjugular portosystemic shunt creation with reduction of portosystemic gradient from 15 mmHg to 6 mmHg. 2. Successful percutaneous coil embolization the left gastric and posterior gastric veins supplying multiple hypertrophied varices. PLAN: - continued resuscitative measures as per the ICU staff. - Repeat endoscopy may be performed at the discretion of the GI service. - The patient will be seen  at the interventional radiology clinic in approximately 4 weeks with postprocedural tips Doppler ultrasound, CMP, INR and ammonia levels. Electronically Signed   By: Sandi Mariscal M.D.   On: 03/02/2022 17:22   IR EMBO VENOUS NOT HEMORR HEMANG  INC  GUIDE ROADMAPPING  Result Date: 03/02/2022 CLINICAL DATA:  History of cirrhosis, admitted with upper GI variceal bleeding, which has persisted despite endoscopy with variceal banding. Patient presents now for image guided TIPS creation with potential variceal embolization. EXAM: 1. Ultrasound-guided access of the right internal jugular vein 2. Ultrasound-guided access of the right common femoral vein 3. Hepatic venogram 4. Intravascular ultrasound 5. Catheterization of the portal vein 6. Portal venous and central manometry 7. Portal venogram 8. Creation of a transhepatic portal vein to hepatic vein shunt 9. Fluoroscopic guided percutaneous coil embolization of gastric varices x2 MEDICATIONS: As antibiotic prophylaxis, Rocephin 2 gm IV was ordered pre-procedure and administered intravenously within one hour of incision. ANESTHESIA/SEDATION: General - as administered by the Anesthesia department CONTRAST:  150 cc ML Omnipaque 300, intravenous COMPARISON:  BRT0 protocol CT scan-02/27/2022 FLUOROSCOPY TIME:  28 minutes, 6 seconds (1,517 mGy) COMPLICATIONS: None immediate. PROCEDURE: Informed written consent was obtained from the patient and the patient's family (via the use of a Forensic scientist) after a thorough discussion of the procedural risks, benefits and alternatives. All questions were addressed. Maximal Sterile Barrier Technique was utilized including caps, mask, sterile gowns, sterile gloves, sterile drape, hand hygiene and skin antiseptic. A timeout was performed prior to the initiation of the procedure. A preliminary ultrasound of the right groin was performed and demonstrates a patent right common femoral vein. A permanent ultrasound image was recorded. Using a combination of fluoroscopy and ultrasound, an access site was determined. A small dermatotomy was made at the planned puncture site. Using ultrasound guidance, access into the right common femoral vein was obtained with visualization of needle  entry into the vessel using a standard micropuncture technique. A wire was advanced into the IVC insert all fascial dilation performed. An 8 [place], 35 cm vascular sheath was placed into the IVC. Through this access site, an 22 [place] ICE catheter was advanced with ease under fluoroscopic guidance to the level of the intrahepatic inferior vena cava. A preliminary ultrasound of the right neck was performed and demonstrates a patent internal jugular vein. A permanent ultrasound image was recorded. Using a combination of fluoroscopy and ultrasound, an access site was determined. A small dermatotomy was made at the planned puncture site. Using ultrasound guidance, access into the right internal jugular vein was obtained with visualization of needle entry into the vessel using a standard micropuncture technique. A wire was advanced into the IVC and serial fascial dilation performed. A 10 French tips sheath was placed into the internal jugular vein and advanced to the IVC. The jugular sheath was retracted into the right atrium and manometry was performed. A 5 French angled tip catheter was then directed into the right hepatic vein. Hepatic venogram was performed. These images demonstrated patent hepatic vein with no stenosis. The catheter was advanced to a wedge portion of the a patent vein over which the 10 French sheath was advanced into the right hepatic vein. Using ICE ultrasound visualization the catheter as right hepatic vein as well as the portal anatomy was defined. A planned exit site from the hepatic vein and puncture site from the portal vein was placed into a single sonographic plane. Under direct ultrasound visualization, the ScorpionX needle was advanced into the central  aspect of the posterior division of the right portal vein. A Glidewire Advantage was then advanced into the splenic vein. A 5 French marking pigtail catheter was then advanced over the wire into the main portal vein and wire removed.  Portal venogram was performed which demonstrated a patent portal vein. Portal manometry was then performed. The tract was then dilated to 8 mm with an 8 mm x 8 cm Athletis balloon. A 8-10 mm by 7 + 2 cm of Viatorr endograft was placed. This was ultimately dilated to 8 mm. After placement of the shunt, right atrial and portal pressures were repeated. Completion portal venogram demonstrates a patent TIPS endograft. Utilizing a C2 catheter, the posterior gastric vein was catheterized and contrast injection was performed demonstrating contribution to multiple hypertrophied varices. As such, with the use of a Navicross catheter, multiple overlapping 6 mm diameter Azur 0.035 Coils were deployed to near the vessel's confluence with the splenic vein. Next, the C2 catheter was utilized to select the left gastric vein and contrast injection was performed demonstrating contribution to multiple hypertrophied varices. As such, multiple overlapping 10 mm diameter Azur 0.035 Coils were deployed to near the vessel's confluence with the splenic vein. The C2 catheter was then exchanged over the [place] catheter for a measuring pigtail catheter and completion splenic and portal venograms were performed The catheters and sheath were removed and manual compression was applied to the right internal jugular and right common femoral venous access sites until hemostasis was achieved. The patient was transferred to the PACU in stable condition. Pre-TIPS Mean Pressures (mmHg): Right atrium: 20 Portal vein: 35 Portosystemic gradient: 15 Post-TIPS Mean Pressures (mmHg): Right atrium: 35 Portal vein: 28 Portosystemic gradient: 6 IMPRESSION: 1. Successful transjugular portosystemic shunt creation with reduction of portosystemic gradient from 15 mmHg to 6 mmHg. 2. Successful percutaneous coil embolization the left gastric and posterior gastric veins supplying multiple hypertrophied varices. PLAN: - continued resuscitative measures as  per the ICU staff. - Repeat endoscopy may be performed at the discretion of the GI service. - The patient will be seen at the interventional radiology clinic in approximately 4 weeks with postprocedural tips Doppler ultrasound, CMP, INR and ammonia levels. Electronically Signed   By: Sandi Mariscal M.D.   On: 03/02/2022 17:22   IR US Guide Vasc Access Right  Result Date: 03/02/2022 CLINICAL DATA:  History of cirrhosis, admitted with upper GI variceal bleeding, which has persisted despite endoscopy with variceal banding. Patient presents now for image guided TIPS creation with potential variceal embolization. EXAM: 1. Ultrasound-guided access of the right internal jugular vein 2. Ultrasound-guided access of the right common femoral vein 3. Hepatic venogram 4. Intravascular ultrasound 5. Catheterization of the portal vein 6. Portal venous and central manometry 7. Portal venogram 8. Creation of a transhepatic portal vein to hepatic vein shunt 9. Fluoroscopic guided percutaneous coil embolization of gastric varices x2 MEDICATIONS: As antibiotic prophylaxis, Rocephin 2 gm IV was ordered pre-procedure and administered intravenously within one hour of incision. ANESTHESIA/SEDATION: General - as administered by the Anesthesia department CONTRAST:  150 cc ML Omnipaque 300, intravenous COMPARISON:  BRT0 protocol CT scan-02/27/2022 FLUOROSCOPY TIME:  28 minutes, 6 seconds (5,366 mGy) COMPLICATIONS: None immediate. PROCEDURE: Informed written consent was obtained from the patient and the patient's family (via the use of a Forensic scientist) after a thorough discussion of the procedural risks, benefits and alternatives. All questions were addressed. Maximal Sterile Barrier Technique was utilized including caps, mask, sterile gowns, sterile gloves, sterile drape, hand  hygiene and skin antiseptic. A timeout was performed prior to the initiation of the procedure. A preliminary ultrasound of the right groin was performed and  demonstrates a patent right common femoral vein. A permanent ultrasound image was recorded. Using a combination of fluoroscopy and ultrasound, an access site was determined. A small dermatotomy was made at the planned puncture site. Using ultrasound guidance, access into the right common femoral vein was obtained with visualization of needle entry into the vessel using a standard micropuncture technique. A wire was advanced into the IVC insert all fascial dilation performed. An 8 [place], 35 cm vascular sheath was placed into the IVC. Through this access site, an 28 [place] ICE catheter was advanced with ease under fluoroscopic guidance to the level of the intrahepatic inferior vena cava. A preliminary ultrasound of the right neck was performed and demonstrates a patent internal jugular vein. A permanent ultrasound image was recorded. Using a combination of fluoroscopy and ultrasound, an access site was determined. A small dermatotomy was made at the planned puncture site. Using ultrasound guidance, access into the right internal jugular vein was obtained with visualization of needle entry into the vessel using a standard micropuncture technique. A wire was advanced into the IVC and serial fascial dilation performed. A 10 French tips sheath was placed into the internal jugular vein and advanced to the IVC. The jugular sheath was retracted into the right atrium and manometry was performed. A 5 French angled tip catheter was then directed into the right hepatic vein. Hepatic venogram was performed. These images demonstrated patent hepatic vein with no stenosis. The catheter was advanced to a wedge portion of the a patent vein over which the 10 French sheath was advanced into the right hepatic vein. Using ICE ultrasound visualization the catheter as right hepatic vein as well as the portal anatomy was defined. A planned exit site from the hepatic vein and puncture site from the portal vein was placed into a single  sonographic plane. Under direct ultrasound visualization, the ScorpionX needle was advanced into the central aspect of the posterior division of the right portal vein. A Glidewire Advantage was then advanced into the splenic vein. A 5 French marking pigtail catheter was then advanced over the wire into the main portal vein and wire removed. Portal venogram was performed which demonstrated a patent portal vein. Portal manometry was then performed. The tract was then dilated to 8 mm with an 8 mm x 8 cm Athletis balloon. A 8-10 mm by 7 + 2 cm of Viatorr endograft was placed. This was ultimately dilated to 8 mm. After placement of the shunt, right atrial and portal pressures were repeated. Completion portal venogram demonstrates a patent TIPS endograft. Utilizing a C2 catheter, the posterior gastric vein was catheterized and contrast injection was performed demonstrating contribution to multiple hypertrophied varices. As such, with the use of a Navicross catheter, multiple overlapping 6 mm diameter Azur 0.035 Coils were deployed to near the vessel's confluence with the splenic vein. Next, the C2 catheter was utilized to select the left gastric vein and contrast injection was performed demonstrating contribution to multiple hypertrophied varices. As such, multiple overlapping 10 mm diameter Azur 0.035 Coils were deployed to near the vessel's confluence with the splenic vein. The C2 catheter was then exchanged over the [place] catheter for a measuring pigtail catheter and completion splenic and portal venograms were performed The catheters and sheath were removed and manual compression was applied to the right internal jugular and right  common femoral venous access sites until hemostasis was achieved. The patient was transferred to the PACU in stable condition. Pre-TIPS Mean Pressures (mmHg): Right atrium: 20 Portal vein: 35 Portosystemic gradient: 15 Post-TIPS Mean Pressures (mmHg): Right atrium: 35 Portal  vein: 28 Portosystemic gradient: 6 IMPRESSION: 1. Successful transjugular portosystemic shunt creation with reduction of portosystemic gradient from 15 mmHg to 6 mmHg. 2. Successful percutaneous coil embolization the left gastric and posterior gastric veins supplying multiple hypertrophied varices. PLAN: - continued resuscitative measures as per the ICU staff. - Repeat endoscopy may be performed at the discretion of the GI service. - The patient will be seen at the interventional radiology clinic in approximately 4 weeks with postprocedural tips Doppler ultrasound, CMP, INR and ammonia levels. Electronically Signed   By: Sandi Mariscal M.D.   On: 03/02/2022 17:22   IR IVUS EACH ADDITIONAL NON CORONARY VESSEL  Result Date: 03/02/2022 CLINICAL DATA:  History of cirrhosis, admitted with upper GI variceal bleeding, which has persisted despite endoscopy with variceal banding. Patient presents now for image guided TIPS creation with potential variceal embolization. EXAM: 1. Ultrasound-guided access of the right internal jugular vein 2. Ultrasound-guided access of the right common femoral vein 3. Hepatic venogram 4. Intravascular ultrasound 5. Catheterization of the portal vein 6. Portal venous and central manometry 7. Portal venogram 8. Creation of a transhepatic portal vein to hepatic vein shunt 9. Fluoroscopic guided percutaneous coil embolization of gastric varices x2 MEDICATIONS: As antibiotic prophylaxis, Rocephin 2 gm IV was ordered pre-procedure and administered intravenously within one hour of incision. ANESTHESIA/SEDATION: General - as administered by the Anesthesia department CONTRAST:  150 cc ML Omnipaque 300, intravenous COMPARISON:  BRT0 protocol CT scan-02/27/2022 FLUOROSCOPY TIME:  28 minutes, 6 seconds (6,222 mGy) COMPLICATIONS: None immediate. PROCEDURE: Informed written consent was obtained from the patient and the patient's family (via the use of a Forensic scientist) after a thorough discussion of  the procedural risks, benefits and alternatives. All questions were addressed. Maximal Sterile Barrier Technique was utilized including caps, mask, sterile gowns, sterile gloves, sterile drape, hand hygiene and skin antiseptic. A timeout was performed prior to the initiation of the procedure. A preliminary ultrasound of the right groin was performed and demonstrates a patent right common femoral vein. A permanent ultrasound image was recorded. Using a combination of fluoroscopy and ultrasound, an access site was determined. A small dermatotomy was made at the planned puncture site. Using ultrasound guidance, access into the right common femoral vein was obtained with visualization of needle entry into the vessel using a standard micropuncture technique. A wire was advanced into the IVC insert all fascial dilation performed. An 8 [place], 35 cm vascular sheath was placed into the IVC. Through this access site, an 55 [place] ICE catheter was advanced with ease under fluoroscopic guidance to the level of the intrahepatic inferior vena cava. A preliminary ultrasound of the right neck was performed and demonstrates a patent internal jugular vein. A permanent ultrasound image was recorded. Using a combination of fluoroscopy and ultrasound, an access site was determined. A small dermatotomy was made at the planned puncture site. Using ultrasound guidance, access into the right internal jugular vein was obtained with visualization of needle entry into the vessel using a standard micropuncture technique. A wire was advanced into the IVC and serial fascial dilation performed. A 10 French tips sheath was placed into the internal jugular vein and advanced to the IVC. The jugular sheath was retracted into the right atrium and manometry was performed.  A 5 French angled tip catheter was then directed into the right hepatic vein. Hepatic venogram was performed. These images demonstrated patent hepatic vein with no stenosis.  The catheter was advanced to a wedge portion of the a patent vein over which the 10 French sheath was advanced into the right hepatic vein. Using ICE ultrasound visualization the catheter as right hepatic vein as well as the portal anatomy was defined. A planned exit site from the hepatic vein and puncture site from the portal vein was placed into a single sonographic plane. Under direct ultrasound visualization, the ScorpionX needle was advanced into the central aspect of the posterior division of the right portal vein. A Glidewire Advantage was then advanced into the splenic vein. A 5 French marking pigtail catheter was then advanced over the wire into the main portal vein and wire removed. Portal venogram was performed which demonstrated a patent portal vein. Portal manometry was then performed. The tract was then dilated to 8 mm with an 8 mm x 8 cm Athletis balloon. A 8-10 mm by 7 + 2 cm of Viatorr endograft was placed. This was ultimately dilated to 8 mm. After placement of the shunt, right atrial and portal pressures were repeated. Completion portal venogram demonstrates a patent TIPS endograft. Utilizing a C2 catheter, the posterior gastric vein was catheterized and contrast injection was performed demonstrating contribution to multiple hypertrophied varices. As such, with the use of a Nav[place] catheter, multiple overlapping 6 mm diameter Azur 0.035 Coils were deployed to near the vessel's confluence with the splenic vein. Next, the C2 catheter was utilized to select the left gastric vein and contrast injection was performed demonstrating contribution to multiple hypertrophied varices. As such, multiple overlapping 10 mm diameter Azur 0.035 Coils were deployed to near the vessel's confluence with the splenic vein. The C2 catheter was then exchanged over the [place] catheter for a measuring pigtail catheter and completion splenic and portal venograms were performed The catheters and sheath were  removed and manual compression was applied to the right internal jugular and right common femoral venous access sites until hemostasis was achieved. The patient was transferred to the PACU in stable condition. Pre-TIPS Mean Pressures (mmHg): Right atrium: 20 Portal vein: 35 Portosystemic gradient: 15 Post-TIPS Mean Pressures (mmHg): Right atrium: 35 Portal vein: 28 Portosystemic gradient: 6 IMPRESSION: 1. Successful transjugular portosystemic shunt creation with reduction of portosystemic gradient from 15 mmHg to 6 mmHg. 2. Successful percutaneous coil embolization the left gastric and posterior gastric veins supplying multiple hypertrophied varices. PLAN: - continued resuscitative measures as per the ICU staff. - Repeat endoscopy may be performed at the discretion of the GI service. - The patient will be seen at the interventional radiology clinic in approximately 4 weeks with postprocedural tips Doppler ultrasound, CMP, INR and ammonia levels. Electronically Signed   By: Sandi Mariscal M.D.   On: 03/02/2022 17:22   ECHOCARDIOGRAM COMPLETE  Result Date: 02/28/2022    ECHOCARDIOGRAM REPORT   Patient Name:   Daniel Harmon Date of Exam: 02/28/2022 Medical Rec #:  [id number]   Height:       66.1 in Accession #:    [id number]  Weight:       220.0 lb Date of Birth:  03-Jan-1971   BSA:          2.086 m Patient Age:    29 years    BP:           108/55 mmHg Patient Gender: M  HR:           95 bpm. Exam Location:  Inpatient Procedure: 2D Echo, 3D Echo, Cardiac Doppler and Color Doppler Indications:    Alcoholic cirrhosis of liver ([place]) [571.2.ICD-9-CM]  History:        Patient has no prior history of Echocardiogram examinations.                 Risk Factors:Former Smoker.  Sonographer:    Greer Pickerel Referring Phys: [phone] [facility]  Sonographer Comments: Suboptimal subcostal window and suboptimal parasternal window. Image acquisition challenging due to respiratory motion and Image acquisition challenging due to  patient body habitus. IMPRESSIONS  1. Left ventricular ejection fraction, by estimation, is >75%. Left ventricular ejection fraction by 3D volume is 77 %. The left ventricle has hyperdynamic function. The left ventricle has no regional wall motion abnormalities. There is mild concentric left ventricular hypertrophy. Left ventricular diastolic parameters are consistent with Grade I diastolic dysfunction (impaired relaxation).  2. Right ventricular systolic function is normal. The right ventricular size is normal. There is normal pulmonary artery systolic pressure.  3. The mitral valve is normal in structure. Trivial mitral valve regurgitation.  4. The aortic valve is tricuspid. Aortic valve regurgitation is not visualized. No aortic stenosis is present.  5. Aortic dilatation noted. There is borderline dilatation of the aortic root, measuring 37 mm.  6. The inferior vena cava is normal in size with greater than 50% respiratory variability, suggesting right atrial pressure of 3 mmHg. Comparison(s): No prior Echocardiogram. FINDINGS  Left Ventricle: Left ventricular ejection fraction, by estimation, is >75%. Left ventricular ejection fraction by 3D volume is 77 %. The left ventricle has hyperdynamic function. The left ventricle has no regional wall motion abnormalities. The left ventricular internal cavity size was normal in size. There is mild concentric left ventricular hypertrophy. Left ventricular diastolic parameters are consistent with Grade I diastolic dysfunction (impaired relaxation). Right Ventricle: The right ventricular size is normal. No increase in right ventricular wall thickness. Right ventricular systolic function is normal. There is normal pulmonary artery systolic pressure. The tricuspid regurgitant velocity is 2.00 m/s, and  with an assumed right atrial pressure of 8 mmHg, the estimated right ventricular systolic pressure is 57.3 mmHg. Left Atrium: Left atrial size was normal in size. Right Atrium:  Right atrial size was normal in size. Pericardium: There is no evidence of pericardial effusion. Mitral Valve: The mitral valve is normal in structure. Trivial mitral valve regurgitation. Tricuspid Valve: The tricuspid valve is normal in structure. Tricuspid valve regurgitation is trivial. Aortic Valve: There are elevated gradients across the aortic valve likely due to high output state with no evidence of aortic stenosis. The aortic valve is tricuspid. Aortic valve regurgitation is not visualized. No aortic stenosis is present. Aortic valve mean gradient measures 11.5 mmHg. Aortic valve peak gradient measures 18.7 mmHg. Aortic valve area, by VTI measures 3.80 cm. Pulmonic Valve: The pulmonic valve was normal in structure. Pulmonic valve regurgitation is trivial. Aorta: Aortic dilatation noted. There is borderline dilatation of the aortic root, measuring 37 mm. Venous: The inferior vena cava is normal in size with greater than 50% respiratory variability, suggesting right atrial pressure of 3 mmHg. IAS/Shunts: The atrial septum is grossly normal.  LEFT VENTRICLE PLAX 2D LVIDd:         4.60 cm         Diastology LVIDs:         2.80 cm  LV e' medial:    9.32 cm/s LV PW:         1.30 cm         LV E/e' medial:  9.5 LV IVS:        1.30 cm         LV e' lateral:   14.50 cm/s LVOT diam:     2.30 cm         LV E/e' lateral: 6.1 LV SV:         132 LV SV Index:   63 LVOT Area:     4.15 cm        3D Volume EF                                LV 3D EF:    Left                                             ventricul                                             ar                                             ejection                                             fraction                                             by 3D                                             volume is                                             77 %.                                 3D Volume EF:                                3D EF:        77 %                                 LV EDV:       127 ml  LV ESV:       30 ml                                LV SV:        97 ml RIGHT VENTRICLE RV S prime:     23.10 cm/s TAPSE (M-mode): 3.0 cm LEFT ATRIUM             Index        RIGHT ATRIUM           Index LA diam:        4.00 cm 1.92 cm/m   RA Area:     25.70 cm LA Vol (A2C):   26.0 ml 12.46 ml/m  RA Volume:   80.90 ml  38.78 ml/m LA Vol (A4C):   62.3 ml 29.86 ml/m LA Biplane Vol: 44.2 ml 21.19 ml/m  AORTIC VALVE AV Area (Vmax):    3.67 cm AV Area (Vmean):   3.28 cm AV Area (VTI):     3.80 cm AV Vmax:           216.00 cm/s AV Vmean:          159.500 cm/s AV VTI:            0.348 m AV Peak Grad:      18.7 mmHg AV Mean Grad:      11.5 mmHg LVOT Vmax:         191.00 cm/s LVOT Vmean:        126.000 cm/s LVOT VTI:          0.318 m LVOT/AV VTI ratio: 0.91  AORTA Ao Root diam: 3.70 cm Ao Asc diam:  3.20 cm MITRAL VALVE               TRICUSPID VALVE MV Area (PHT): 3.21 cm    TR Peak grad:   16.0 mmHg MV Decel Time: 236 msec    TR Vmax:        200.00 cm/s MV E velocity: 88.10 cm/s MV A velocity: 97.90 cm/s  SHUNTS MV E/A ratio:  0.90        Systemic VTI:  0.32 m                            Systemic Diam: 2.30 cm Gwyndolyn Kaufman MD Electronically signed by Gwyndolyn Kaufman MD Signature Date/Time: 02/28/2022/4:26:40 PM    Final    CT ANGIO GI BLEED  Result Date: 02/27/2022 CLINICAL DATA:  Hematochezia EXAM: CTA ABDOMEN AND PELVIS WITHOUT AND WITH CONTRAST TECHNIQUE: Multidetector CT imaging of the abdomen and pelvis was performed using the standard protocol during bolus administration of intravenous contrast. Multiplanar reconstructed images and MIPs were obtained and reviewed to evaluate the vascular anatomy. RADIATION DOSE REDUCTION: This exam was performed according to the departmental dose-optimization program which includes automated exposure control, adjustment of the mA and/or kV according to patient size and/or use of iterative  reconstruction technique. CONTRAST:  142m OMNIPAQUE IOHEXOL 350 MG/ML SOLN COMPARISON:  None Available. FINDINGS: VASCULAR Aorta: Initial precontrast images demonstrate no hyperdense crescent to suggest acute aortic injury. Post-contrast images demonstrate no evidence of aneurysmal dilatation or dissection. Celiac: Patent without evidence of aneurysm, dissection, vasculitis or significant stenosis. SMA: Patent without evidence of aneurysm, dissection, vasculitis or significant stenosis. Renals: Dual renal arteries are noted bilaterally. No focal stenoses are seen. IMA: Patent without evidence  of aneurysm, dissection, vasculitis or significant stenosis. Inflow: Iliacs are well visualized and within normal limits. Veins: Esophageal and perisplenic varices are noted. Review of the MIP images confirms the above findings. NON-VASCULAR Lower chest: The lung bases are free of acute infiltrate or sizable effusion. Hepatobiliary: Liver demonstrates diffuse nodularity consistent with underlying cirrhosis. A small 1 cm hypodensity is noted in the right lobe near the dome of the diaphragm best seen on image number 9 of series 11. This is too small for adequate characterization but likely represents a small cyst. Pancreas: Unremarkable. No pancreatic ductal dilatation or surrounding inflammatory changes. Spleen: Spleen is within normal limits. Adrenals/Urinary Tract: Adrenal glands are unremarkable. Kidneys demonstrate a normal enhancement pattern bilaterally. No renal calculi or obstructive changes are seen. The bladder is partially distended. Stomach/Bowel: Scattered diverticular change of the colon is noted without evidence of diverticulitis. The appendix is well visualized and within normal limits. No pooling of contrast in the colon is identified to suggest acute colonic hemorrhage. Multiple somewhat nodular areas of increased attenuation are noted in the distal esophagus and GE junction consistent with small varices. No  pooling in the small bowel is noted to suggest acute small-bowel hemorrhage. Lymphatic: No sizable lymphadenopathy is noted. Reproductive: Prostate is unremarkable. Other: No abdominal wall hernia or abnormality. No abdominopelvic ascites. Musculoskeletal: No acute bony abnormality noted. IMPRESSION: VASCULAR No acute arterial or venous abnormality is noted. No findings to suggest acute GI hemorrhage are seen. Esophageal and splenic varices. NON-VASCULAR Diverticulosis without diverticulitis. Cirrhotic change of the liver with subsequent sequelae of portal hypertension. No evidence of acute GI hemorrhage is seen. Small hypodensity in the right lobe of the liver, too small for characterization but likely representing a small cyst. Electronically Signed   By: Inez Catalina M.D.   On: 02/27/2022 20:58    Labs:  CBC: Recent Labs    02/28/22 0403 02/28/22 1226 03/01/22 0340 03/01/22 1039 03/01/22 2004 03/02/22 0556 03/02/22 2204 03/03/22 0117  WBC 11.9*  --  9.5  --   --  5.3  --  9.3  HGB 9.1*   < > 7.3*   < > 8.4* 8.5* 9.8* 9.8*  HCT 25.5*   < > 21.2*   < > 23.7* 23.2* 28.0* 27.1*  PLT 97*  --  65*  --   --  59*  --  101*   < > = values in this interval not displayed.    COAGS: Recent Labs    02/27/22 0757 02/27/22 1856 02/28/22 0403 03/02/22 0818  INR 1.7* 1.7* 1.5* 1.5*  APTT 30 24  --   --     BMP: Recent Labs    02/28/22 0403 03/01/22 0340 03/02/22 0556 03/03/22 0117  NA 142 143 138 135  K 3.9 3.6 3.9 4.0  CL 113* 113* 108 103  CO2 22 26 24  21*  GLUCOSE 132* 115* 99 142*  BUN 28* 20 11 9   CALCIUM 7.3* 7.3* 7.6* 7.5*  CREATININE 1.07 0.98 0.90 0.94  GFRNONAA >60 >60 >60 >60    LIVER FUNCTION TESTS: Recent Labs    02/27/22 0445 02/27/22 1658 03/02/22 0556 03/03/22 0117  BILITOT 1.6* 1.2 1.1 1.3*  AST 34  --  59* 62*  ALT 33  --  49* 65*  ALKPHOS 66  --  39 52  PROT 6.3*  --  3.9* 4.7*  ALBUMIN 3.5  --  2.2* 2.5*    Assessment and Plan:  GI bleed s/p  TIPS procedure 03/02/22  by Dr. Pascal Lux Patient s/p TIPS procedure.  Doing well this AM.  Mild elevation in liver function tests, not unexpected after procedure.  Overall stable.  No confusion.  No further bloody bowel movements.  Follow-up with IR in 3 months-- schedulers to arrange.   Electronically Signed: Docia Barrier, PA 03/03/2022, 2:48 PM   I spent a total of 15 Minutes at the the patient's bedside AND on the patient's hospital floor or unit, greater than 50% of which was counseling/coordinating care for GI bleed

## 2022-03-03 NOTE — Progress Notes (Signed)
[facility] ADULT ICU REPLACEMENT PROTOCOL   The patient does apply for the [facility] Adult ICU Electrolyte Replacment Protocol based on the criteria listed below:   1.Exclusion criteria: TCTS patients, ECMO patients, and Dialysis patients 2. Is GFR >/= 30 ml/min? Yes.    Patient's GFR today is >60 3. Is SCr </= 2? Yes.   Patient's SCr is 0.94 mg/dL 4. Did SCr increase >/= 0.5 in 24 hours? No. 5.Pt's weight >40kg  Yes.   6. Abnormal electrolyte(s): mag 1.8  7. Electrolytes replaced per protocol 8.  Call MD STAT for K+ </= 2.5, Phos </= 1, or Mag </= 1 Physician:  n/a  Daniel Harmon 03/03/2022 4:55 AM

## 2022-03-03 NOTE — Progress Notes (Signed)
[facility] Gastroenterology Progress Note  Daniel Harmon 51 y.o. 1971/04/23  CC: GI bleed   Subjective: Patient seen and examined at bedside.  Family at bedside.  Feeling much better today.  No bowel movement today.  Denies nausea and vomiting.  Tolerating diet.  ROS : Afebrile, negative for chest pain, negative for abdominal pain   Objective: Vital signs in last 24 hours: Vitals:   03/03/22 1200 03/03/22 1240  BP: 127/71 127/72  Pulse: 64 65  Resp: 20 19  Temp: 98.1 F (36.7 C)   SpO2:      Physical Exam:  General:  Alert, cooperative, no distress, appears stated age  Head:  Normocephalic, without obvious abnormality, atraumatic  Eyes:  , EOM's intact,   Lungs:   Clear to auscultation bilaterally, respirations unlabored  Heart:  Regular rate and rhythm, S1, S2 normal  Abdomen:   Soft, non-tender, nondistended, bowel sounds present, no peritoneal signs  Extremities: Extremities normal, atraumatic, no  edema  Pulses: 2+ and symmetric    Lab Results: Recent Labs    03/02/22 0556 03/03/22 0117  NA 138 135  K 3.9 4.0  CL 108 103  CO2 24 21*  GLUCOSE 99 142*  BUN 11 9  CREATININE 0.90 0.94  CALCIUM 7.6* 7.5*  MG 1.8 1.8   Recent Labs    03/02/22 0556 03/03/22 0117  AST 59* 62*  ALT 49* 65*  ALKPHOS 39 52  BILITOT 1.1 1.3*  PROT 3.9* 4.7*  ALBUMIN 2.2* 2.5*   Recent Labs    03/02/22 0556 03/02/22 2204 03/03/22 0117  WBC 5.3  --  9.3  NEUTROABS 3.1  --  8.0*  HGB 8.5* 9.8* 9.8*  HCT 23.2* 28.0* 27.1*  MCV 89.2  --  87.7  PLT 59*  --  101*   Recent Labs    03/02/22 0818  LABPROT 17.6*  INR 1.5*      Assessment/Plan: -Variceal bleeding.  Status post EGD with band ligation 10/15 with 5 bands placement.  Patient with ongoing bleeding melanotic stool.  CT angio negative for active bleeding.  S/p IR guided TIPS placement as well as percutaneous coil embolization of left gastric and posterior gastric veins 10/18    -Decompensated cirrhosis.  Prior  alcohol use.  Quit 3 years ago.  -History of NSAID use  Recommendations -------------------------- -Feeling better.  Alert and oriented x3.  No bowel movement today. -Lactulose and rifaximin. -Continue supportive care. -DC octreotide -Consider changing IV Protonix to p.o. tomorrow -GI will follow  Otis Brace MD, FACP 03/03/2022, 2:34 PM  Contact #  [phone]

## 2022-03-03 NOTE — Addendum Note (Signed)
Addendum  created 03/03/22 1925 by Moshe Salisbury, CRNA   Intraprocedure Event edited

## 2022-03-03 NOTE — Progress Notes (Addendum)
NAME:  Daniel Harmon, MRN:  [id number], DOB:  April 28, 1971, LOS: 4 ADMISSION DATE:  02/27/2022, CONSULTATION DATE:  02/27/22 REFERRING MD:  Dr. Marylyn Ishihara, CHIEF COMPLAINT:  Hemorrhagic Shock   History of Present Illness:  51 year old male with a history of cirrhosis.  He presented on 02/27/2022 with hematemesis that was of abrupt onset no recent ethanol use according to chart review but has admitted to using 14 standard drinks of alcohol per week.  Last drink not known.  Denies any drug use.  He was using nonsteroidal anti-inflammatory drugs.  He is a previous smoker having quit 5 years ago.  On 02/27/2022 underwent upper endoscopy by Dr. Randel Pigg of [facility] with variceal banding.  History of bleeding esophageal varices in the lower one third of esophagus.  The stomach with fresh blood and large clots.  Duodenal bulb with fresh blood but no active bleeding.  Second portion of the duodenum was unremarkable.  Status post esophageal variceal band ligation x5 minutes with good hemostasis.  Started on IV PPI and IV octreotide and transferred to the floor.  Subsequently around 6 PM 02/27/2022 patient went to the bathroom and then had a large bloody bowel movement that according to the wife mostly described as melena.  Subsequent to this patient had a syncopal episode associated with significant hypotension.  Transferred to the ICU.  Initially started on  Levophed 10 mcg/min through peripheral intravenous line and systolic blood pressure 85 systolic.  CCM consulted and pt transferred to [facility] from [facility].   Initial labs with hgb 11.1, normal Cr but elevated BUN. Negative hepatitis panel.  Pt given 2 units yesterday of pRBC.    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 States doing well and ate breakfast. Review of Systems:   Negative unless stated in the subjective.  Objective: febrile, normotensive, satting well on RA.   Blood pressure 118/61, pulse 71, temperature 98.8 F (37.1 C), temperature source Oral, resp. rate 17, weight 104 kg, SpO2 95 %.        Intake/Output Summary (Last 24 hours) at 03/03/2022 0746 Last data filed at 03/03/2022 0639 Gross per 24 hour  Intake 4231.3 ml  Output 4250 ml  Net -18.7 ml    Filed Weights   02/28/22 0104 03/02/22 0500  Weight: 99.8 kg 104 kg   Intake: 4.2 L Output: 4.3 L Negative: -18.7 cc Stool: 0x  Examination:  General: NAD HENT: NCAT, MMM, anicteric sclera.  Lungs: No distress.  Clear to auscultation bilaterally Cardiovascular: NSR, good pulses Abdomen: Soft, slight TTP on right side and epigastric area.  Normal bowel sounds. No obvious ascites. Extremities: No asymmetry. SCD in place Neuro: alert and oriented x4 GU: No foley.    Consults  IR, GI Resolved Hospital Problem list    Assessment & Plan:  ABLA 2/2 to UGI S/p 7 units of pRBCs.  Hgb 9.8 this am. No GI bleeding noted and hgb stable. Pt is one day post TIPS and stable. Plan to transfer out today. GI following.    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 MELD 14 at admit Suspected alcoholic 1st episode of GI Variceal Bleed - s/p banding  02/27/22.   Hx of alcohol use but none since last 3 years. On PPI BID, Octreotide gtt, and Rocephin 2 g qd. Status post tips by IR. Gi following.  -Continue PPI, Octreotide, and Rocephin. Plan for 5 days of octreotide and rocephin. Today is day 5.   Hypocalcemia Pseudohypocalcemia. Calcium 7.5, corrected 8.7. -CTM   Hyperglycemia of Critical Illness On SSI. Goal 140-180. Last CBG is 128. A1c 5.1.   Best Practice (right click and "Reselect all  SmartList Selections" daily)  Diet/type: Regular diet DVT prophylaxis: SCD GI prophylaxis: PPI Lines: N/A Foley:  N/A Continuous: Octreotide Code Status:  full code Last date of multidisciplinary goals of care discussion [03/03/22] Labs: CBC with normal WBC, Hgb 9.8, Plt 101k. BMP with normal K and normal Na. Calcium low at 7.5 but corrected 8.7.  Slight transaminase elevation that is stable. Mag 1.8. CBG 101.  CBC: Recent Labs  Lab 02/27/22 0447 02/27/22 1020 02/28/22 0403 02/28/22 1226 03/01/22 0340 03/01/22 1039 03/01/22 2004 03/02/22 0556 03/02/22 2204 03/03/22 0117  WBC 10.8*  --  11.9*  --  9.5  --   --  5.3  --  9.3  NEUTROABS 8.3*  --   --   --   --   --   --  3.1  --  8.0*  HGB 11.1*   < > 9.1*   < > 7.3* 7.2* 8.4* 8.5* 9.8* 9.8*  HCT 32.1*   < > 25.5*   < > 21.2* 20.9* 23.7* 23.2* 28.0* 27.1*  MCV 97.0  --  89.8  --  90.6  --   --  89.2  --  87.7  PLT 138*  --  97*  --  65*  --   --  59*  --  101*   < > = values in this interval not displayed.    Basic Metabolic Panel: Recent Labs  Lab 02/27/22 1856 02/28/22 0403 03/01/22 0340 03/02/22 0556 03/03/22 0117  NA 142 142 143 138 135  K 4.0 3.9 3.6 3.9 4.0  CL 115* 113* 113* 108 103  CO2 20* 22 26 24  21*  GLUCOSE 193* 132* 115* 99 142*  BUN 36* 28* 20 11 9   CREATININE 1.18 1.07 0.98 0.90 0.94  CALCIUM 6.9* 7.3* 7.3* 7.6* 7.5*  MG  --  1.8  --  1.8 1.8    GFR: CrCl cannot be calculated (Unknown ideal weight.). Recent Labs  Lab 02/28/22 0403 02/28/22 0930 02/28/22 1713 02/28/22 2126 03/01/22 0025 03/01/22 0340 03/02/22 0556 03/03/22 0117  WBC 11.9*  --   --   --   --  9.5 5.3 9.3  LATICACIDVEN 2.2*   < > 2.8* 3.0* 2.7* 1.5  --   --    < > = values in this interval not displayed.    Liver Function Tests: Recent Labs  Lab 02/27/22 0445 02/27/22 1658 03/02/22 0556 03/03/22 0117  AST 34  --  59* 62*  ALT 33  --  49* 65*  ALKPHOS 66  --  39 52  BILITOT 1.6* 1.2 1.1 1.3*  PROT 6.3*  --  3.9*  4.7*  ALBUMIN 3.5  --  2.2* 2.5*    No results for input(s): "LIPASE", "AMYLASE" in the last 168 hours. No results for input(s): "AMMONIA" in the last 168 hours. ABG No results found for: "PHART", "PCO2ART", "PO2ART", "HCO3", "TCO2", "ACIDBASEDEF", "O2SAT"  Coagulation Profile: Recent Labs  Lab 02/27/22 0757 02/27/22 1856 02/28/22 0403 03/02/22 0818  INR 1.7* 1.7*  1.5* 1.5*    Cardiac Enzymes: No results for input(s): "CKTOTAL", "CKMB", "CKMBINDEX", "TROPONINI" in the last 168 hours. HbA1C: Hgb A1c MFr Bld  Date/Time Value Ref Range Status  02/27/2022 04:58 PM 5.1 4.8 - 5.6 % Final    Comment:    (NOTE) Pre diabetes:          5.7%-6.4%  Diabetes:              >6.4%  Glycemic control for   <7.0% adults with diabetes    CBG: Recent Labs  Lab 03/02/22 0720 03/02/22 1916 03/02/22 2335 03/03/22 0329 03/03/22 0740  GLUCAP 101* 129* 178* 122* 128*    Past Medical History:  He,  has a past medical history of Cirrhosis ([place]).  Surgical History:   Past Surgical History:  Procedure Laterality Date   ESOPHAGEAL BANDING  02/27/2022   Procedure: ESOPHAGEAL BANDING;  Surgeon: Loney Laurence, DO;  Location: [facility];  Service: Gastroenterology;;   ESOPHAGOGASTRODUODENOSCOPY N/A 02/27/2022   Procedure: ESOPHAGOGASTRODUODENOSCOPY (EGD);  Surgeon: Loney Laurence, DO;  Location: [facility];  Service: Gastroenterology;  Laterality: N/A;   IR EMBO VENOUS NOT HEMORR HEMANG  INC GUIDE ROADMAPPING  03/02/2022   IR IVUS EACH ADDITIONAL NON CORONARY VESSEL  03/02/2022   IR TIPS  03/02/2022   IR US GUIDE VASC ACCESS RIGHT  03/02/2022   NO PAST SURGERIES      Social History:   reports that he quit smoking about 5 years ago. His smoking use included cigarettes. He has never used smokeless tobacco. He reports current alcohol use of about 14.0 standard drinks of alcohol per week. He reports that he does not use drugs.  Family History:  His family history includes  Alcoholism in his father.  Allergies No Known Allergies  Home Medications  Prior to Admission medications   Medication Sig Start Date End Date Taking? Authorizing Provider  Creatine POWD Take 1 Scoop by mouth daily as needed (after work out).   Yes [provider]  Cyanocobalamin (B-12 PO) Take 1 tablet by mouth daily.   Yes [provider]  Ibuprofen-diphenhydrAMINE Cit (ADVIL PM PO) Take 1 tablet by mouth at bedtime as needed (sleep).   Yes [provider]  MELATONIN PO Take 1 tablet by mouth at bedtime as needed (sleep).   Yes [provider]  OVER THE COUNTER MEDICATION Take 1 capsule by mouth daily. OTC Herbal Detox   Yes [provider]    Idamae Schuller, MD Tillie Rung. [facility] Internal Medicine Residency, PGY-2

## 2022-03-04 ENCOUNTER — Other Ambulatory Visit (HOSPITAL_COMMUNITY): Payer: Self-pay

## 2022-03-04 LAB — TYPE AND SCREEN
ABO/RH(D): O POS
Antibody Screen: NEGATIVE
Unit division: 0
Unit division: 0
Unit division: 0
Unit division: 0
Unit division: 0

## 2022-03-04 LAB — BPAM RBC
Blood Product Expiration Date: 202310242359
Blood Product Expiration Date: 202311162359
Blood Product Expiration Date: 202311162359
Blood Product Expiration Date: 202311172359
Blood Product Expiration Date: 202311172359
ISSUE DATE / TIME: 202310161913
ISSUE DATE / TIME: 202310162113
ISSUE DATE / TIME: 202310171238
ISSUE DATE / TIME: 202310181307
ISSUE DATE / TIME: 202310181307
Unit Type and Rh: 5100
Unit Type and Rh: 5100
Unit Type and Rh: 5100
Unit Type and Rh: 5100
Unit Type and Rh: 5100

## 2022-03-04 LAB — CBC
HCT: 28.6 % — ABNORMAL LOW (ref 39.0–52.0)
Hemoglobin: 10.2 g/dL — ABNORMAL LOW (ref 13.0–17.0)
MCH: 31.9 pg (ref 26.0–34.0)
MCHC: 35.7 g/dL (ref 30.0–36.0)
MCV: 89.4 fL (ref 80.0–100.0)
Platelets: 105 10*3/uL — ABNORMAL LOW (ref 150–400)
RBC: 3.2 MIL/uL — ABNORMAL LOW (ref 4.22–5.81)
RDW: 17.4 % — ABNORMAL HIGH (ref 11.5–15.5)
WBC: 10 10*3/uL (ref 4.0–10.5)
nRBC: 0.4 % — ABNORMAL HIGH (ref 0.0–0.2)

## 2022-03-04 LAB — GLUCOSE, CAPILLARY
Glucose-Capillary: 93 mg/dL (ref 70–99)
Glucose-Capillary: 82 mg/dL (ref 70–99)
Glucose-Capillary: 118 mg/dL — ABNORMAL HIGH (ref 70–99)

## 2022-03-04 MED ORDER — PANTOPRAZOLE SODIUM 40 MG PO TBEC
40.0000 mg | DELAYED_RELEASE_TABLET | Freq: Two times a day (BID) | ORAL | 0 refills | Status: DC
  Filled 2022-03-04: qty 60, 30d supply, fill #0

## 2022-03-04 MED ORDER — LACTULOSE 10 GM/15ML PO SOLN
20.0000 g | Freq: Three times a day (TID) | ORAL | 0 refills | Status: DC
  Filled 2022-03-04: qty 946, 11d supply, fill #0

## 2022-03-04 MED ORDER — LACTULOSE 10 GM/15ML PO SOLN: 10.0000 g | Freq: Two times a day (BID) | ORAL | Status: DC | PRN

## 2022-03-04 MED ORDER — LACTULOSE 10 GM/15ML PO SOLN
10.0000 g | Freq: Two times a day (BID) | ORAL | 0 refills | Status: AC | PRN
  Filled 2022-03-04: qty 946, 32d supply, fill #0

## 2022-03-04 MED ORDER — PANTOPRAZOLE SODIUM 40 MG PO TBEC
40.0000 mg | DELAYED_RELEASE_TABLET | Freq: Every day | ORAL | Status: DC
  Administered 2022-03-05: 40 mg via ORAL
  Filled 2022-03-04: qty 1

## 2022-03-04 MED ORDER — PANTOPRAZOLE SODIUM 40 MG PO TBEC
40.0000 mg | DELAYED_RELEASE_TABLET | Freq: Every day | ORAL | 0 refills | Status: AC
  Filled 2022-03-04: qty 30, 30d supply, fill #0

## 2022-03-04 MED ORDER — RIFAXIMIN 550 MG PO TABS
550.0000 mg | ORAL_TABLET | Freq: Two times a day (BID) | ORAL | 0 refills | Status: AC
  Filled 2022-03-04: qty 42, 21d supply, fill #0

## 2022-03-04 NOTE — Plan of Care (Signed)
  Problem: Education: Goal: Knowledge of General Education information will improve Description: Including pain rating scale, medication(s)/side effects and non-pharmacologic comfort measures Outcome: Progressing   Problem: Activity: Goal: Risk for activity intolerance will decrease Outcome: Progressing   Problem: Coping: Goal: Level of anxiety will decrease Outcome: Progressing   Problem: Pain Managment: Goal: General experience of comfort will improve Outcome: Progressing   Problem: Safety: Goal: Ability to remain free from injury will improve Outcome: Progressing   Problem: Nutritional: Goal: Maintenance of adequate nutrition will improve Outcome: Progressing

## 2022-03-04 NOTE — Progress Notes (Signed)
PROGRESS NOTE    Daniel Harmon  KWI:[id number] DOB: 06/21/1970 DOA: 02/27/2022 PCP: Pcp, No    Chief Complaint  Patient presents with   Hematemesis    Brief Narrative:   51 year old male with a history of cirrhosis.  He presented on 02/27/2022 with hematemesis that was of abrupt onset no recent ethanol use according to chart review but has admitted to using 14 standard drinks of alcohol per week.  Last drink not known.  Denies any drug use.  He was using nonsteroidal anti-inflammatory drugs.  He is a previous smoker having quit 5 years ago.  On 02/27/2022 underwent upper endoscopy by Dr. Randel Pigg of [facility] with variceal banding.  History of bleeding esophageal varices in the lower one third of esophagus.  The stomach with fresh blood and large clots.  Duodenal bulb with fresh blood but no active bleeding.  Second portion of the duodenum was unremarkable.  Status post esophageal variceal band ligation x5 minutes with good hemostasis.  Started on IV PPI and IV octreotide and transferred to the floor.  Subsequently around 6 PM 02/27/2022 patient went to the bathroom and then had a large bloody bowel movement that according to the wife mostly described as melena.  Subsequent to this patient had a syncopal episode associated with significant hypotension.  Transferred to the ICU.  Initially started on  Levophed 10 mcg/min through peripheral intravenous line and systolic blood pressure 85 systolic.  CCM consulted and pt transferred to [facility] from [facility].   Initial labs with hgb 11.1, normal Cr but elevated BUN. Negative hepatitis panel.  Pt given 2 units yesterday of pRBC.   Assessment & Plan:   Principal Problem:   Upper GI bleed Active Problems:   Alcoholic cirrhosis of liver (HCC)   Hyperglycemia   Hyperbilirubinemia   Hemorrhagic shock (HCC)   Due to blood loss anemia secondary to upper GI bleed -Required 7 units PRBC transfusion during hospital stay  -Heme globin remained stable over last 48  hours s/p 7 units of pRBCs.    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 MELD 14 at admit Suspected alcoholic  GI Variceal Bleed  - s/p banding  S/p IR guided TIPS placement as well as percutaneous coil embolization of left gastric and posterior gastric veins 10/18  -Currently treated with rifaximin, lactulose -Treated with octreotide, PPI and Rocephin for 5 days   Hypocalcemia Pseudohypocalcemia. Calcium 7.5, corrected 8.7.  Hyperglycemia of Critical Illness  A1c 5.1. Further hypoglycemia will DC sliding scale      DVT prophylaxis:  Code Status: Full Family Communication: Wife at bedside Disposition:   Status is: Inpatient    Consultants:  PCCM [facility]  Subjective:  reports multiple bowel movements overnight, normal color, no melena.  Objective: Vitals:   03/03/22 2347 03/04/22 0434 03/04/22 0800 03/04/22 1200  BP: 134/70 113/61 129/69 93/63  Pulse:  62 67 69  Resp:   15 20  Temp: 98.7 F (37.1 C) 98.8 F (37.1 C)  98.2 F (36.8 C)  TempSrc: Oral Oral  Oral  SpO2:      Weight:       No intake or output data in the 24 hours ending 03/04/22 1403 Filed Weights   02/28/22 0104 03/02/22 0500  Weight: 99.8 kg 104 kg    Examination:  General exam: Appears calm and comfortable  Respiratory system: Clear to auscultation. Respiratory effort normal. Cardiovascular system: S1 & S2 heard, RRR. No JVD, murmurs, rubs, gallops or clicks. No pedal edema.  Gastrointestinal system: Abdomen is nondistended, soft and nontender. No organomegaly or masses felt. Normal bowel sounds heard. Central nervous system: Alert and oriented. No focal neurological deficits. Extremities: Symmetric 5 x 5 power. Skin: No rashes, lesions or ulcers Psychiatry: Judgement and insight appear normal. Mood & affect appropriate.     Data Reviewed: I have personally reviewed following labs and imaging studies  CBC: Recent Labs  Lab 02/27/22 0447 02/27/22 1020 02/28/22 0403  02/28/22 1226 03/01/22 0340 03/01/22 1039 03/02/22 0556 03/02/22 1413 03/02/22 2204 03/03/22 0117 03/04/22 0835  WBC 10.8*  --  11.9*  --  9.5  --  5.3  --   --  9.3 10.0  NEUTROABS 8.3*  --   --   --   --   --  3.1  --   --  8.0*  --   HGB 11.1*   < > 9.1*   < > 7.3*   < > 8.5* 7.5* 9.8* 9.8* 10.2*  HCT 32.1*   < > 25.5*   < > 21.2*   < > 23.2* 22.0* 28.0* 27.1* 28.6*  MCV 97.0  --  89.8  --  90.6  --  89.2  --   --  87.7 89.4  PLT 138*  --  97*  --  65*  --  59*  --   --  101* 105*   < > = values in this interval not displayed.    Basic Metabolic Panel: Recent Labs  Lab 02/27/22 1856 02/28/22 0403 03/01/22 0340 03/02/22 0556 03/02/22 1413 03/03/22 0117  NA 142 142 143 138 138 135  K 4.0 3.9 3.6 3.9 3.7 4.0  CL 115* 113* 113* 108  --  103  CO2 20* 22 26 24   --  21*  GLUCOSE 193* 132* 115* 99  --  142*  BUN 36* 28* 20 11  --  9  CREATININE 1.18 1.07 0.98 0.90  --  0.94  CALCIUM 6.9* 7.3* 7.3* 7.6*  --  7.5*  MG  --  1.8  --  1.8  --  1.8    GFR: CrCl cannot be calculated (Unknown ideal weight.).  Liver Function Tests: Recent Labs  Lab 02/27/22 0445 02/27/22 1658 03/02/22 0556 03/03/22 0117  AST 34  --  59* 62*  ALT 33  --  49* 65*  ALKPHOS 66  --  39 52  BILITOT 1.6* 1.2 1.1 1.3*  PROT 6.3*  --  3.9* 4.7*  ALBUMIN 3.5  --  2.2* 2.5*    CBG: Recent Labs  Lab 03/03/22 2016 03/03/22 2345 03/04/22 0433 03/04/22 0754 03/04/22 1157  GLUCAP 93 106* 93 82 118*     Recent Results (from the past 240 hour(s))  MRSA Next Gen by PCR, Nasal     Status: None   Collection Time: 02/27/22  8:54 PM   Specimen: Nasal Mucosa; Nasal Swab  Result Value Ref Range Status   MRSA by PCR Next Gen NOT DETECTED NOT DETECTED Final    Comment: (NOTE) The GeneXpert MRSA Assay (FDA approved for NASAL specimens only), is one component of a comprehensive MRSA colonization surveillance program. It is not intended to diagnose MRSA infection nor to guide or monitor treatment  for MRSA infections. Test performance is not FDA approved in patients less than 15 years old. Performed at [facility], [place] [address] [place], [place] [address]   Surgical pcr screen     Status: None   Collection Time: 02/28/22  1:12  AM   Specimen: Nasal Mucosa; Nasal Swab  Result Value Ref Range Status   MRSA, PCR NEGATIVE NEGATIVE Final   Staphylococcus aureus NEGATIVE NEGATIVE Final    Comment: (NOTE) The Xpert SA Assay (FDA approved for NASAL specimens in patients 48 years of age and older), is one component of a comprehensive surveillance program. It is not intended to diagnose infection nor to guide or monitor treatment. Performed at [facility], [place] [address] [place], [place] [address]          Radiology Studies: IR Tips  Result Date: 03/02/2022 CLINICAL DATA:  History of cirrhosis, admitted with upper GI variceal bleeding, which has persisted despite endoscopy with variceal banding. Patient presents now for image guided TIPS creation with potential variceal embolization. EXAM: 1. Ultrasound-guided access of the right internal jugular vein 2. Ultrasound-guided access of the right common femoral vein 3. Hepatic venogram 4. Intravascular ultrasound 5. Catheterization of the portal vein 6. Portal venous and central manometry 7. Portal venogram 8. Creation of a transhepatic portal vein to hepatic vein shunt 9. Fluoroscopic guided percutaneous coil embolization of gastric varices x2 MEDICATIONS: As antibiotic prophylaxis, Rocephin 2 gm IV was ordered pre-procedure and administered intravenously within one hour of incision. ANESTHESIA/SEDATION: General - as administered by the Anesthesia department CONTRAST:  150 cc ML Omnipaque 300, intravenous COMPARISON:  BRT0 protocol CT scan-02/27/2022 FLUOROSCOPY TIME:  28 minutes, 6 seconds (8,338 mGy) COMPLICATIONS: None immediate. PROCEDURE: Informed written consent was obtained from the patient and the patient's  family (via the use of a Forensic scientist) after a thorough discussion of the procedural risks, benefits and alternatives. All questions were addressed. Maximal Sterile Barrier Technique was utilized including caps, mask, sterile gowns, sterile gloves, sterile drape, hand hygiene and skin antiseptic. A timeout was performed prior to the initiation of the procedure. A preliminary ultrasound of the right groin was performed and demonstrates a patent right common femoral vein. A permanent ultrasound image was recorded. Using a combination of fluoroscopy and ultrasound, an access site was determined. A small dermatotomy was made at the planned puncture site. Using ultrasound guidance, access into the right common femoral vein was obtained with visualization of needle entry into the vessel using a standard micropuncture technique. A wire was advanced into the IVC insert all fascial dilation performed. An 8 [place], 35 cm vascular sheath was placed into the IVC. Through this access site, an 22 [place] ICE catheter was advanced with ease under fluoroscopic guidance to the level of the intrahepatic inferior vena cava. A preliminary ultrasound of the right neck was performed and demonstrates a patent internal jugular vein. A permanent ultrasound image was recorded. Using a combination of fluoroscopy and ultrasound, an access site was determined. A small dermatotomy was made at the planned puncture site. Using ultrasound guidance, access into the right internal jugular vein was obtained with visualization of needle entry into the vessel using a standard micropuncture technique. A wire was advanced into the IVC and serial fascial dilation performed. A 10 French tips sheath was placed into the internal jugular vein and advanced to the IVC. The jugular sheath was retracted into the right atrium and manometry was performed. A 5 French angled tip catheter was then directed into the right hepatic vein. Hepatic venogram was  performed. These images demonstrated patent hepatic vein with no stenosis. The catheter was advanced to a wedge portion of the a patent vein over which the 10 French sheath was advanced into the right hepatic vein.  Using ICE ultrasound visualization the catheter as right hepatic vein as well as the portal anatomy was defined. A planned exit site from the hepatic vein and puncture site from the portal vein was placed into a single sonographic plane. Under direct ultrasound visualization, the ScorpionX needle was advanced into the central aspect of the posterior division of the right portal vein. A Glidewire Advantage was then advanced into the splenic vein. A 5 French marking pigtail catheter was then advanced over the wire into the main portal vein and wire removed. Portal venogram was performed which demonstrated a patent portal vein. Portal manometry was then performed. The tract was then dilated to 8 mm with an 8 mm x 8 cm Athletis balloon. A 8-10 mm by 7 + 2 cm of Viatorr endograft was placed. This was ultimately dilated to 8 mm. After placement of the shunt, right atrial and portal pressures were repeated. Completion portal venogram demonstrates a patent TIPS endograft. Utilizing a C2 catheter, the posterior gastric vein was catheterized and contrast injection was performed demonstrating contribution to multiple hypertrophied varices. As such, with the use of a Navicross catheter, multiple overlapping 6 mm diameter [place] 0.035 Coils were deployed to near the vessel's confluence with the splenic vein. Next, the C2 catheter was utilized to select the left gastric vein and contrast injection was performed demonstrating contribution to multiple hypertrophied varices. As such, multiple overlapping 10 mm diameter Azur 0.035 Coils were deployed to near the vessel's confluence with the splenic vein. The C2 catheter was then exchanged over the [place] catheter for a measuring pigtail catheter and completion  splenic and portal venograms were performed The catheters and sheath were removed and manual compression was applied to the right internal jugular and right common femoral venous access sites until hemostasis was achieved. The patient was transferred to the PACU in stable condition. Pre-TIPS Mean Pressures (mmHg): Right atrium: 20 Portal vein: 35 Portosystemic gradient: 15 Post-TIPS Mean Pressures (mmHg): Right atrium: 35 Portal vein: 28 Portosystemic gradient: 6 IMPRESSION: 1. Successful transjugular portosystemic shunt creation with reduction of portosystemic gradient from 15 mmHg to 6 mmHg. 2. Successful percutaneous coil embolization the left gastric and posterior gastric veins supplying multiple hypertrophied varices. PLAN: - continued resuscitative measures as per the ICU staff. - Repeat endoscopy may be performed at the discretion of the GI service. - The patient will be seen at the interventional radiology clinic in approximately 4 weeks with postprocedural tips Doppler ultrasound, CMP, INR and ammonia levels. Electronically Signed   By: Sandi Mariscal M.D.   On: 03/02/2022 17:22   IR EMBO VENOUS NOT HEMORR HEMANG  INC GUIDE ROADMAPPING  Result Date: 03/02/2022 CLINICAL DATA:  History of cirrhosis, admitted with upper GI variceal bleeding, which has persisted despite endoscopy with variceal banding. Patient presents now for image guided TIPS creation with potential variceal embolization. EXAM: 1. Ultrasound-guided access of the right internal jugular vein 2. Ultrasound-guided access of the right common femoral vein 3. Hepatic venogram 4. Intravascular ultrasound 5. Catheterization of the portal vein 6. Portal venous and central manometry 7. Portal venogram 8. Creation of a transhepatic portal vein to hepatic vein shunt 9. Fluoroscopic guided percutaneous coil embolization of gastric varices x2 MEDICATIONS: As antibiotic prophylaxis, Rocephin 2 gm IV was ordered pre-procedure and administered intravenously  within one hour of incision. ANESTHESIA/SEDATION: General - as administered by the Anesthesia department CONTRAST:  150 cc ML Omnipaque 300, intravenous COMPARISON:  BRT0 protocol CT scan-02/27/2022 FLUOROSCOPY TIME:  28 minutes, 6 seconds (  6,720 mGy) COMPLICATIONS: None immediate. PROCEDURE: Informed written consent was obtained from the patient and the patient's family (via the use of a Forensic scientist) after a thorough discussion of the procedural risks, benefits and alternatives. All questions were addressed. Maximal Sterile Barrier Technique was utilized including caps, mask, sterile gowns, sterile gloves, sterile drape, hand hygiene and skin antiseptic. A timeout was performed prior to the initiation of the procedure. A preliminary ultrasound of the right groin was performed and demonstrates a patent right common femoral vein. A permanent ultrasound image was recorded. Using a combination of fluoroscopy and ultrasound, an access site was determined. A small dermatotomy was made at the planned puncture site. Using ultrasound guidance, access into the right common femoral vein was obtained with visualization of needle entry into the vessel using a standard micropuncture technique. A wire was advanced into the IVC insert all fascial dilation performed. An 8 [place], 35 cm vascular sheath was placed into the IVC. Through this access site, an 22 [place] ICE catheter was advanced with ease under fluoroscopic guidance to the level of the intrahepatic inferior vena cava. A preliminary ultrasound of the right neck was performed and demonstrates a patent internal jugular vein. A permanent ultrasound image was recorded. Using a combination of fluoroscopy and ultrasound, an access site was determined. A small dermatotomy was made at the planned puncture site. Using ultrasound guidance, access into the right internal jugular vein was obtained with visualization of needle entry into the vessel using a standard  micropuncture technique. A wire was advanced into the IVC and serial fascial dilation performed. A 10 French tips sheath was placed into the internal jugular vein and advanced to the IVC. The jugular sheath was retracted into the right atrium and manometry was performed. A 5 French angled tip catheter was then directed into the right hepatic vein. Hepatic venogram was performed. These images demonstrated patent hepatic vein with no stenosis. The catheter was advanced to a wedge portion of the a patent vein over which the 10 French sheath was advanced into the right hepatic vein. Using ICE ultrasound visualization the catheter as right hepatic vein as well as the portal anatomy was defined. A planned exit site from the hepatic vein and puncture site from the portal vein was placed into a single sonographic plane. Under direct ultrasound visualization, the ScorpionX needle was advanced into the central aspect of the posterior division of the right portal vein. A Glidewire Advantage was then advanced into the splenic vein. A 5 French marking pigtail catheter was then advanced over the wire into the main portal vein and wire removed. Portal venogram was performed which demonstrated a patent portal vein. Portal manometry was then performed. The tract was then dilated to 8 mm with an 8 mm x 8 cm Athletis balloon. A 8-10 mm by 7 + 2 cm of Viatorr endograft was placed. This was ultimately dilated to 8 mm. After placement of the shunt, right atrial and portal pressures were repeated. Completion portal venogram demonstrates a patent TIPS endograft. Utilizing a C2 catheter, the posterior gastric vein was catheterized and contrast injection was performed demonstrating contribution to multiple hypertrophied varices. As such, with the use of a Navicross catheter, multiple overlapping 6 mm diameter Azur 0.035 Coils were deployed to near the vessel's confluence with the splenic vein. Next, the C2 catheter was utilized to select the  left gastric vein and contrast injection was performed demonstrating contribution to multiple hypertrophied varices. As such, multiple overlapping 10 mm diameter  Azur 0.035 Coils were deployed to near the vessel's confluence with the splenic vein. The C2 catheter was then exchanged over the [place] catheter for a measuring pigtail catheter and completion splenic and portal venograms were performed The catheters and sheath were removed and manual compression was applied to the right internal jugular and right common femoral venous access sites until hemostasis was achieved. The patient was transferred to the PACU in stable condition. Pre-TIPS Mean Pressures (mmHg): Right atrium: 20 Portal vein: 35 Portosystemic gradient: 15 Post-TIPS Mean Pressures (mmHg): Right atrium: 35 Portal vein: 28 Portosystemic gradient: 6 IMPRESSION: 1. Successful transjugular portosystemic shunt creation with reduction of portosystemic gradient from 15 mmHg to 6 mmHg. 2. Successful percutaneous coil embolization the left gastric and posterior gastric veins supplying multiple hypertrophied varices. PLAN: - continued resuscitative measures as per the ICU staff. - Repeat endoscopy may be performed at the discretion of the GI service. - The patient will be seen at the interventional radiology clinic in approximately 4 weeks with postprocedural tips Doppler ultrasound, CMP, INR and ammonia levels. Electronically Signed   By: Sandi Mariscal M.D.   On: 03/02/2022 17:22   IR US Guide Vasc Access Right  Result Date: 03/02/2022 CLINICAL DATA:  History of cirrhosis, admitted with upper GI variceal bleeding, which has persisted despite endoscopy with variceal banding. Patient presents now for image guided TIPS creation with potential variceal embolization. EXAM: 1. Ultrasound-guided access of the right internal jugular vein 2. Ultrasound-guided access of the right common femoral vein 3. Hepatic venogram 4. Intravascular ultrasound 5.  Catheterization of the portal vein 6. Portal venous and central manometry 7. Portal venogram 8. Creation of a transhepatic portal vein to hepatic vein shunt 9. Fluoroscopic guided percutaneous coil embolization of gastric varices x2 MEDICATIONS: As antibiotic prophylaxis, Rocephin 2 gm IV was ordered pre-procedure and administered intravenously within one hour of incision. ANESTHESIA/SEDATION: General - as administered by the Anesthesia department CONTRAST:  150 cc ML Omnipaque 300, intravenous COMPARISON:  BRT0 protocol CT scan-02/27/2022 FLUOROSCOPY TIME:  28 minutes, 6 seconds (1,517 mGy) COMPLICATIONS: None immediate. PROCEDURE: Informed written consent was obtained from the patient and the patient's family (via the use of a Forensic scientist) after a thorough discussion of the procedural risks, benefits and alternatives. All questions were addressed. Maximal Sterile Barrier Technique was utilized including caps, mask, sterile gowns, sterile gloves, sterile drape, hand hygiene and skin antiseptic. A timeout was performed prior to the initiation of the procedure. A preliminary ultrasound of the right groin was performed and demonstrates a patent right common femoral vein. A permanent ultrasound image was recorded. Using a combination of fluoroscopy and ultrasound, an access site was determined. A small dermatotomy was made at the planned puncture site. Using ultrasound guidance, access into the right common femoral vein was obtained with visualization of needle entry into the vessel using a standard micropuncture technique. A wire was advanced into the IVC insert all fascial dilation performed. An 8 [place], 35 cm vascular sheath was placed into the IVC. Through this access site, an 27 [place] ICE catheter was advanced with ease under fluoroscopic guidance to the level of the intrahepatic inferior vena cava. A preliminary ultrasound of the right neck was performed and demonstrates a patent internal jugular  vein. A permanent ultrasound image was recorded. Using a combination of fluoroscopy and ultrasound, an access site was determined. A small dermatotomy was made at the planned puncture site. Using ultrasound guidance, access into the right internal jugular vein was obtained  with visualization of needle entry into the vessel using a standard micropuncture technique. A wire was advanced into the IVC and serial fascial dilation performed. A 10 French tips sheath was placed into the internal jugular vein and advanced to the IVC. The jugular sheath was retracted into the right atrium and manometry was performed. A 5 French angled tip catheter was then directed into the right hepatic vein. Hepatic venogram was performed. These images demonstrated patent hepatic vein with no stenosis. The catheter was advanced to a wedge portion of the a patent vein over which the 10 French sheath was advanced into the right hepatic vein. Using ICE ultrasound visualization the catheter as right hepatic vein as well as the portal anatomy was defined. A planned exit site from the hepatic vein and puncture site from the portal vein was placed into a single sonographic plane. Under direct ultrasound visualization, the ScorpionX needle was advanced into the central aspect of the posterior division of the right portal vein. A Glidewire Advantage was then advanced into the splenic vein. A 5 French marking pigtail catheter was then advanced over the wire into the main portal vein and wire removed. Portal venogram was performed which demonstrated a patent portal vein. Portal manometry was then performed. The tract was then dilated to 8 mm with an 8 mm x 8 cm Athletis balloon. A 8-10 mm by 7 + 2 cm of Viatorr endograft was placed. This was ultimately dilated to 8 mm. After placement of the shunt, right atrial and portal pressures were repeated. Completion portal venogram demonstrates a patent TIPS endograft. Utilizing a C2 catheter, the posterior  gastric vein was catheterized and contrast injection was performed demonstrating contribution to multiple hypertrophied varices. As such, with the use of a Navicross catheter, multiple overlapping 6 mm diameter Azur 0.035 Coils were deployed to near the vessel's confluence with the splenic vein. Next, the C2 catheter was utilized to select the left gastric vein and contrast injection was performed demonstrating contribution to multiple hypertrophied varices. As such, multiple overlapping 10 mm diameter Azur 0.035 Coils were deployed to near the vessel's confluence with the splenic vein. The C2 catheter was then exchanged over the [place] catheter for a measuring pigtail catheter and completion splenic and portal venograms were performed The catheters and sheath were removed and manual compression was applied to the right internal jugular and right common femoral venous access sites until hemostasis was achieved. The patient was transferred to the PACU in stable condition. Pre-TIPS Mean Pressures (mmHg): Right atrium: 20 Portal vein: 35 Portosystemic gradient: 15 Post-TIPS Mean Pressures (mmHg): Right atrium: 35 Portal vein: 28 Portosystemic gradient: 6 IMPRESSION: 1. Successful transjugular portosystemic shunt creation with reduction of portosystemic gradient from 15 mmHg to 6 mmHg. 2. Successful percutaneous coil embolization the left gastric and posterior gastric veins supplying multiple hypertrophied varices. PLAN: - continued resuscitative measures as per the ICU staff. - Repeat endoscopy may be performed at the discretion of the GI service. - The patient will be seen at the interventional radiology clinic in approximately 4 weeks with postprocedural tips Doppler ultrasound, CMP, INR and ammonia levels. Electronically Signed   By: Sandi Mariscal M.D.   On: 03/02/2022 17:22   IR IVUS EACH ADDITIONAL NON CORONARY VESSEL  Result Date: 03/02/2022 CLINICAL DATA:  History of cirrhosis, admitted with  upper GI variceal bleeding, which has persisted despite endoscopy with variceal banding. Patient presents now for image guided TIPS creation with potential variceal embolization. EXAM: 1. Ultrasound-guided access of  the right internal jugular vein 2. Ultrasound-guided access of the right common femoral vein 3. Hepatic venogram 4. Intravascular ultrasound 5. Catheterization of the portal vein 6. Portal venous and central manometry 7. Portal venogram 8. Creation of a transhepatic portal vein to hepatic vein shunt 9. Fluoroscopic guided percutaneous coil embolization of gastric varices x2 MEDICATIONS: As antibiotic prophylaxis, Rocephin 2 gm IV was ordered pre-procedure and administered intravenously within one hour of incision. ANESTHESIA/SEDATION: General - as administered by the Anesthesia department CONTRAST:  150 cc ML Omnipaque 300, intravenous COMPARISON:  BRT0 protocol CT scan-02/27/2022 FLUOROSCOPY TIME:  28 minutes, 6 seconds (7,353 mGy) COMPLICATIONS: None immediate. PROCEDURE: Informed written consent was obtained from the patient and the patient's family (via the use of a Forensic scientist) after a thorough discussion of the procedural risks, benefits and alternatives. All questions were addressed. Maximal Sterile Barrier Technique was utilized including caps, mask, sterile gowns, sterile gloves, sterile drape, hand hygiene and skin antiseptic. A timeout was performed prior to the initiation of the procedure. A preliminary ultrasound of the right groin was performed and demonstrates a patent right common femoral vein. A permanent ultrasound image was recorded. Using a combination of fluoroscopy and ultrasound, an access site was determined. A small dermatotomy was made at the planned puncture site. Using ultrasound guidance, access into the right common femoral vein was obtained with visualization of needle entry into the vessel using a standard micropuncture technique. A wire was advanced into the IVC  insert all fascial dilation performed. An 8 [place], 35 cm vascular sheath was placed into the IVC. Through this access site, an 75 [place] ICE catheter was advanced with ease under fluoroscopic guidance to the level of the intrahepatic inferior vena cava. A preliminary ultrasound of the right neck was performed and demonstrates a patent internal jugular vein. A permanent ultrasound image was recorded. Using a combination of fluoroscopy and ultrasound, an access site was determined. A small dermatotomy was made at the planned puncture site. Using ultrasound guidance, access into the right internal jugular vein was obtained with visualization of needle entry into the vessel using a standard micropuncture technique. A wire was advanced into the IVC and serial fascial dilation performed. A 10 French tips sheath was placed into the internal jugular vein and advanced to the IVC. The jugular sheath was retracted into the right atrium and manometry was performed. A 5 French angled tip catheter was then directed into the right hepatic vein. Hepatic venogram was performed. These images demonstrated patent hepatic vein with no stenosis. The catheter was advanced to a wedge portion of the a patent vein over which the 10 French sheath was advanced into the right hepatic vein. Using ICE ultrasound visualization the catheter as right hepatic vein as well as the portal anatomy was defined. A planned exit site from the hepatic vein and puncture site from the portal vein was placed into a single sonographic plane. Under direct ultrasound visualization, the ScorpionX needle was advanced into the central aspect of the posterior division of the right portal vein. A Glidewire Advantage was then advanced into the splenic vein. A 5 French marking pigtail catheter was then advanced over the wire into the main portal vein and wire removed. Portal venogram was performed which demonstrated a patent portal vein. Portal manometry was then  performed. The tract was then dilated to 8 mm with an 8 mm x 8 cm Athletis balloon. A 8-10 mm by 7 + 2 cm of Viatorr endograft was placed. This  was ultimately dilated to 8 mm. After placement of the shunt, right atrial and portal pressures were repeated. Completion portal venogram demonstrates a patent TIPS endograft. Utilizing a C2 catheter, the posterior gastric vein was catheterized and contrast injection was performed demonstrating contribution to multiple hypertrophied varices. As such, with the use of a Navicross catheter, multiple overlapping 6 mm diameter [place] 0.035 Coils were deployed to near the vessel's confluence with the splenic vein. Next, the C2 catheter was utilized to select the left gastric vein and contrast injection was performed demonstrating contribution to multiple hypertrophied varices. As such, multiple overlapping 10 mm diameter Azur 0.035 Coils were deployed to near the vessel's confluence with the splenic vein. The C2 catheter was then exchanged over the [place] catheter for a measuring pigtail catheter and completion splenic and portal venograms were performed The catheters and sheath were removed and manual compression was applied to the right internal jugular and right common femoral venous access sites until hemostasis was achieved. The patient was transferred to the PACU in stable condition. Pre-TIPS Mean Pressures (mmHg): Right atrium: 20 Portal vein: 35 Portosystemic gradient: 15 Post-TIPS Mean Pressures (mmHg): Right atrium: 35 Portal vein: 28 Portosystemic gradient: 6 IMPRESSION: 1. Successful transjugular portosystemic shunt creation with reduction of portosystemic gradient from 15 mmHg to 6 mmHg. 2. Successful percutaneous coil embolization the left gastric and posterior gastric veins supplying multiple hypertrophied varices. PLAN: - continued resuscitative measures as per the ICU staff. - Repeat endoscopy may be performed at the discretion of the GI service. - The  patient will be seen at the interventional radiology clinic in approximately 4 weeks with postprocedural tips Doppler ultrasound, CMP, INR and ammonia levels. Electronically Signed   By: Sandi Mariscal M.D.   On: 03/02/2022 17:22        Scheduled Meds:  sodium chloride   Intravenous Once   sodium chloride   Intravenous Once   Chlorhexidine Gluconate Cloth  6 each Topical Daily   pantoprazole  40 mg Oral Daily   rifaximin  550 mg Oral BID   Continuous Infusions:  sodium chloride Stopped (02/28/22 0506)   sodium chloride 10 mL/hr at 03/03/22 0900   sodium chloride Stopped (03/01/22 1851)     LOS: 5 days       Phillips Climes, MD [facility]   To contact the attending provider between 7A-7P or the covering provider during after hours 7P-7A, please log into the web site [email or web address] and access using universal [place] password for that web site. If you do not have the password, please call the hospital operator.  03/04/2022, 2:03 PM

## 2022-03-04 NOTE — TOC Transition Note (Signed)
Discharge medications (2) are being stored in the main pharmacy on the ground floor until patient is ready for discharge.   

## 2022-03-04 NOTE — TOC Progression Note (Signed)
Transition of Care [facility]) - Progression Note    Patient Details  Name: Daniel Harmon MRN: [id number] Date of Birth: 03-15-1971  Transition [facility] Care [facility]) CM/SW [place], RN Phone Number: 03/04/2022, 12:39 PM  Clinical Narrative:      MATCH for medications done. Medications filled by TOC They will be in main pharmacy for potential DC in AM., meds will need to be picked up prior to DC.    Barriers to Discharge: No Barriers Identified  Expected Discharge Plan and Services    Home self care Discharge Planning Services: Follow-up appt scheduled, [place] arrangements for the past 2 months: Single Family Home                                       Social Determinants of Health (SDOH) Interventions    Readmission Risk Interventions     No data to display

## 2022-03-04 NOTE — Progress Notes (Signed)
[facility] Gastroenterology Progress Note  Daniel Harmon 51 y.o. 1971/03/23  CC: GI bleed   Subjective: Patient seen and examined at bedside.  Family at bedside.  No further bleeding episodes.  Not having multiple loose stools with use of lactulose.  Denies abdominal pain.  ROS : Afebrile, negative for chest pain, negative for abdominal pain   Objective: Vital signs in last 24 hours: Vitals:   03/04/22 0800 03/04/22 1200  BP: 129/69 93/63  Pulse: 67 69  Resp: 15 20  Temp:  98.2 F (36.8 C)  SpO2:      Physical Exam:  General:  Alert, cooperative, no distress, appears stated age  Head:  Normocephalic, without obvious abnormality, atraumatic  Eyes:  , EOM's intact,   Lungs:   Clear to auscultation bilaterally, respirations unlabored  Heart:  Regular rate and rhythm, S1, S2 normal  Abdomen:   Soft, non-tender, nondistended, bowel sounds present, no peritoneal signs  Extremities: Extremities normal, atraumatic, no  edema  Pulses: 2+ and symmetric    Lab Results: Recent Labs    03/02/22 0556 03/02/22 1413 03/03/22 0117  NA 138 138 135  K 3.9 3.7 4.0  CL 108  --  103  CO2 24  --  21*  GLUCOSE 99  --  142*  BUN 11  --  9  CREATININE 0.90  --  0.94  CALCIUM 7.6*  --  7.5*  MG 1.8  --  1.8   Recent Labs    03/02/22 0556 03/03/22 0117  AST 59* 62*  ALT 49* 65*  ALKPHOS 39 52  BILITOT 1.1 1.3*  PROT 3.9* 4.7*  ALBUMIN 2.2* 2.5*   Recent Labs    03/02/22 0556 03/02/22 1413 03/03/22 0117 03/04/22 0835  WBC 5.3  --  9.3 10.0  NEUTROABS 3.1  --  8.0*  --   HGB 8.5*   < > 9.8* 10.2*  HCT 23.2*   < > 27.1* 28.6*  MCV 89.2  --  87.7 89.4  PLT 59*  --  101* 105*   < > = values in this interval not displayed.   Recent Labs    03/02/22 0818  LABPROT 17.6*  INR 1.5*      Assessment/Plan: -Variceal bleeding.  Status post EGD with band ligation 10/15 with 5 bands placement.  Patient with ongoing bleeding melanotic stool.  CT angio negative for active bleeding.   S/p IR guided TIPS placement as well as percutaneous coil embolization of left gastric and posterior gastric veins 10/18    -Decompensated cirrhosis.  Prior alcohol use.  Quit 3 years ago.  -History of NSAID use  Recommendations -------------------------- -Change lactulose to 10 g twice daily as needed.  Patient and patient's wife was advised to titrate lactulose to have 2-3 soft bowel movements per day. -Continue rifaximin -Pantoprazole changed to 40 mg p.o. once a day. -Low-sodium diet -Follow-up in GI clinic in 4 to 8 weeks after discharge. -GI will sign off.  Okay to discharge from GI standpoint tomorrow.  Call us back if needed  Otis Brace MD, [place] 03/04/2022, 1:48 PM  Contact #  [phone]

## 2022-03-05 ENCOUNTER — Other Ambulatory Visit (HOSPITAL_COMMUNITY): Payer: Self-pay

## 2022-03-05 LAB — COMPREHENSIVE METABOLIC PANEL
ALT: 126 U/L — ABNORMAL HIGH (ref 0–44)
AST: 107 U/L — ABNORMAL HIGH (ref 15–41)
Albumin: 2.5 g/dL — ABNORMAL LOW (ref 3.5–5.0)
Alkaline Phosphatase: 82 U/L (ref 38–126)
Anion gap: 9 (ref 5–15)
BUN: 12 mg/dL (ref 6–20)
CO2: 20 mmol/L — ABNORMAL LOW (ref 22–32)
Calcium: 7.8 mg/dL — ABNORMAL LOW (ref 8.9–10.3)
Chloride: 106 mmol/L (ref 98–111)
Creatinine, Ser: 1 mg/dL (ref 0.61–1.24)
GFR, Estimated: >60 mL/min (ref >60)
Glucose, Bld: 97 mg/dL (ref 70–99)
Potassium: 3.6 mmol/L (ref 3.5–5.1)
Sodium: 135 mmol/L (ref 135–145)
Total Bilirubin: 1.6 mg/dL — ABNORMAL HIGH (ref 0.3–1.2)
Total Protein: 4.7 g/dL — ABNORMAL LOW (ref 6.5–8.1)

## 2022-03-05 LAB — CBC
HCT: 27.6 % — ABNORMAL LOW (ref 39.0–52.0)
Hemoglobin: 9.5 g/dL — ABNORMAL LOW (ref 13.0–17.0)
MCH: 31.3 pg (ref 26.0–34.0)
MCHC: 34.4 g/dL (ref 30.0–36.0)
MCV: 90.8 fL (ref 80.0–100.0)
Platelets: 95 10*3/uL — ABNORMAL LOW (ref 150–400)
RBC: 3.04 MIL/uL — ABNORMAL LOW (ref 4.22–5.81)
RDW: 17.4 % — ABNORMAL HIGH (ref 11.5–15.5)
WBC: 7.6 10*3/uL (ref 4.0–10.5)
nRBC: 0.8 % — ABNORMAL HIGH (ref 0.0–0.2)

## 2022-03-05 NOTE — Discharge Instructions (Signed)
Follow with Primary MD in 7 days   Get CBC, CMP, checked  by Primary MD next visit.    Activity: As tolerated with Full fall precautions use walker/cane & assistance as needed   Disposition Home    Diet: 2 gm SODIUM   On your next visit with your primary care physician please Get Medicines reviewed and adjusted.   Please request your Prim.MD to go over all Hospital Tests and Procedure/Radiological results at the follow up, please get all Hospital records sent to your Prim MD by signing hospital release before you go home.   If you experience worsening of your admission symptoms, develop shortness of breath, life threatening emergency, suicidal or homicidal thoughts you must seek medical attention immediately by calling 911 or calling your MD immediately  if symptoms less severe.  You Must read complete instructions/literature along with all the possible adverse reactions/side effects for all the Medicines you take and that have been prescribed to you. Take any new Medicines after you have completely understood and accpet all the possible adverse reactions/side effects.   Do not drive, operating heavy machinery, perform activities at heights, swimming or participation in water activities or provide baby sitting services if your were admitted for syncope or siezures until you have seen by Primary MD or a Neurologist and advised to do so again.  Do not drive when taking Pain medications.    Do not take more than prescribed Pain, Sleep and Anxiety Medications  Special Instructions: If you have smoked or chewed Tobacco  in the last 2 yrs please stop smoking, stop any regular Alcohol  and or any Recreational drug use.  Wear Seat belts while driving.   Please note  You were cared for by a hospitalist during your hospital stay. If you have any questions about your discharge medications or the care you received while you were in the hospital after you are discharged, you can call the  unit and asked to speak with the hospitalist on call if the hospitalist that took care of you is not available. Once you are discharged, your primary care physician will handle any further medical issues. Please note that NO REFILLS for any discharge medications will be authorized once you are discharged, as it is imperative that you return to your primary care physician (or establish a relationship with a primary care physician if you do not have one) for your aftercare needs so that they can reassess your need for medications and monitor your lab values.

## 2022-03-05 NOTE — Discharge Summary (Signed)
Physician Discharge Summary  Daniel Harmon QRF:[id number] DOB: [birth year] DOA: 02/27/2022  PCP: Pcp, No  Admit date: 02/27/2022 Discharge date: 03/05/2022  Admitted From: Home Disposition:  Home   Recommendations for Outpatient Follow-up:  Follow up with PCP  Please obtain BMP/CBC     Discharge Condition: Stable CODE STATUS:FULL Diet recommendation: 2 Gm sodium  Brief/Interim Summary:  51 year old male with a history of cirrhosis.  He presented on 02/27/2022 with hematemesis that was of abrupt onset no recent ethanol use according to chart review but has admitted to using 14 standard drinks of alcohol per week.  Last drink not known.  Denies any drug use.  He was using nonsteroidal anti-inflammatory drugs.  He is a previous smoker having quit 5 years ago.  On 02/27/2022 underwent upper endoscopy by Dr. Randel Pigg of [facility] with variceal banding.  History of bleeding esophageal varices in the lower one third of esophagus.  The stomach with fresh blood and large clots.  Duodenal bulb with fresh blood but no active bleeding.  Second portion of the duodenum was unremarkable.  Status post esophageal variceal band ligation x5 minutes with good hemostasis.  Started on IV PPI and IV octreotide and transferred to the floor.  Subsequently around 6 PM 02/27/2022 patient went to the bathroom and then had a large bloody bowel movement that according to the wife mostly described as melena.  Subsequent to this patient had a syncopal episode associated with significant hypotension.  Transferred to the ICU.  Initially started on  Levophed 10 mcg/min through peripheral intravenous line and systolic blood pressure 85 systolic.  CCM consulted and pt transferred to [facility] from [facility]. Initial labs with hgb 11.1, normal Cr but elevated BUN. Negative hepatitis panel.  Variceal bleeding.  Status post EGD with band ligation 10/15 with 5 bands placement.  Patient with ongoing bleeding melanotic stool.  CT angio negative for active  bleeding.  S/p IR guided TIPS placement as well as percutaneous coil embolization of left gastric and posterior gastric veins 10/18 .    Acute  blood loss anemia secondary to upper GI bleed -Required 7 units PRBC transfusion during hospital stay  -Hemoglobin remained stable over last 72 hours s/p 7 units of pRBCs.    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 MELD 14 at admit Suspected alcoholic  GI Variceal Bleed  - s/p banding  S/p IR guided TIPS placement as well as percutaneous coil embolization of left gastric and posterior gastric veins 10/18  -Treated with octreotide, IV PPI and Rocephin for 5 days - will DC on Protonix and lactulose per GI recommendation, as well financial assistance form has been filled for rifaximin  Hypocalcemia Pseudohypocalcemia. Calcium 7.5, corrected 8.7.   Hyperglycemia of Critical Illness  A1c 5.1.     Discharge Diagnoses:  Principal Problem:   Upper GI bleed Active Problems:   Alcoholic cirrhosis of liver (HCC)   Hyperglycemia   Hyperbilirubinemia   Hemorrhagic shock (HCC)    Discharge Instructions  Discharge Instructions     Diet - low sodium heart healthy   Complete by: As directed    Discharge instructions   Complete by: As directed    Follow with Primary MD in 7 days   Get CBC, CMP, checked  by Primary MD next visit.    Activity: As tolerated with Full fall precautions use walker/cane & assistance as needed   Disposition Home    Diet: 2 gm SODIUM   On your next visit with your primary care  physician please Get Medicines reviewed and adjusted.   Please request your Prim.MD to go over all Hospital Tests and Procedure/Radiological results at the follow up, please get all Hospital records sent to your Prim MD by signing hospital release before you go home.   If you experience worsening of your admission symptoms, develop shortness of breath, life threatening emergency, suicidal or homicidal thoughts you must seek  medical attention immediately by calling 911 or calling your MD immediately  if symptoms less severe.  You Must read complete instructions/literature along with all the possible adverse reactions/side effects for all the Medicines you take and that have been prescribed to you. Take any new Medicines after you have completely understood and accpet all the possible adverse reactions/side effects.   Do not drive, operating heavy machinery, perform activities at heights, swimming or participation in water activities or provide baby sitting services if your were admitted for syncope or siezures until you have seen by Primary MD or a Neurologist and advised to do so again.  Do not drive when taking Pain medications.    Do not take more than prescribed Pain, Sleep and Anxiety Medications  Special Instructions: If you have smoked or chewed Tobacco  in the last 2 yrs please stop smoking, stop any regular Alcohol  and or any Recreational drug use.  Wear Seat belts while driving.   Please note  You were cared for by a hospitalist during your hospital stay. If you have any questions about your discharge medications or the care you received while you were in the hospital after you are discharged, you can call the unit and asked to speak with the hospitalist on call if the hospitalist that took care of you is not available. Once you are discharged, your primary care physician will handle any further medical issues. Please note that NO REFILLS for any discharge medications will be authorized once you are discharged, as it is imperative that you return to your primary care physician (or establish a relationship with a primary care physician if you do not have one) for your aftercare needs so that they can reassess your need for medications and monitor your lab values.   Increase activity slowly   Complete by: As directed    No wound care   Complete by: As directed       Allergies as of 03/05/2022   No  Known Allergies      Medication List     STOP taking these medications    ADVIL PM PO   Creatine Powd       TAKE these medications    B-12 PO Take 1 tablet by mouth daily.   lactulose 10 GM/15ML solution Commonly known as: CHRONULAC Take 15 mLs (10 g total) by mouth 2 (two) times daily as needed for mild constipation.   MELATONIN PO Take 1 tablet by mouth at bedtime as needed (sleep).   OVER THE COUNTER MEDICATION Take 1 capsule by mouth daily. OTC Herbal Detox   pantoprazole 40 MG tablet Commonly known as: Protonix Tome 1 tableta (40 mg en total) por va oral diariamente. (Take 1 tablet (40 mg total) by mouth daily.)   rifaximin 550 MG Tabs tablet Commonly known as: XIFAXAN Take 1 tablet (550 mg total) by mouth 2 (two) times daily.        Follow-up Information     Sandi Mariscal, MD Follow up.   Specialties: Interventional Radiology, Radiology Why: Schedulers will contact you with date and time  of follow-up appointment. Contact information: [address] [place] [address] [phone]         [facility]. Go on 04/05/2022.   Why: 1:30 Nov 21 Por favor vaya a la hora programada para establecer un mdico de atencin primaria. Si por alguna razn no puede realizar esta cita, llame para reprogramarla. cuentan con asesora financiera y [place]. Contact information: [address] [place] [address] [phone]        Otis Brace, MD. Schedule an appointment as soon as possible for a visit in 4 week(s).   Specialty: Gastroenterology Why: For follow-up on cirrhosis Contact information: [address] [place] [address] [phone]                No Known Allergies  Consultations: PCCM IR Gastroenterology [facility]   Procedures/Studies: IR Tips  Result Date: 03/02/2022 CLINICAL DATA:  History of cirrhosis, admitted with upper GI variceal  bleeding, which has persisted despite endoscopy with variceal banding. Patient presents now for image guided TIPS creation with potential variceal embolization. EXAM: 1. Ultrasound-guided access of the right internal jugular vein 2. Ultrasound-guided access of the right common femoral vein 3. Hepatic venogram 4. Intravascular ultrasound 5. Catheterization of the portal vein 6. Portal venous and central manometry 7. Portal venogram 8. Creation of a transhepatic portal vein to hepatic vein shunt 9. Fluoroscopic guided percutaneous coil embolization of gastric varices x2 MEDICATIONS: As antibiotic prophylaxis, Rocephin 2 gm IV was ordered pre-procedure and administered intravenously within one hour of incision. ANESTHESIA/SEDATION: General - as administered by the Anesthesia department CONTRAST:  150 cc ML Omnipaque 300, intravenous COMPARISON:  BRT0 protocol CT scan-02/27/2022 FLUOROSCOPY TIME:  28 minutes, 6 seconds (6,468 mGy) COMPLICATIONS: None immediate. PROCEDURE: Informed written consent was obtained from the patient and the patient's family (via the use of a Forensic scientist) after a thorough discussion of the procedural risks, benefits and alternatives. All questions were addressed. Maximal Sterile Barrier Technique was utilized including caps, mask, sterile gowns, sterile gloves, sterile drape, hand hygiene and skin antiseptic. A timeout was performed prior to the initiation of the procedure. A preliminary ultrasound of the right groin was performed and demonstrates a patent right common femoral vein. A permanent ultrasound image was recorded. Using a combination of fluoroscopy and ultrasound, an access site was determined. A small dermatotomy was made at the planned puncture site. Using ultrasound guidance, access into the right common femoral vein was obtained with visualization of needle entry into the vessel using a standard micropuncture technique. A wire was advanced into the IVC insert all fascial  dilation performed. An 8 [place], 35 cm vascular sheath was placed into the IVC. Through this access site, an 42 [place] ICE catheter was advanced with ease under fluoroscopic guidance to the level of the intrahepatic inferior vena cava. A preliminary ultrasound of the right neck was performed and demonstrates a patent internal jugular vein. A permanent ultrasound image was recorded. Using a combination of fluoroscopy and ultrasound, an access site was determined. A small dermatotomy was made at the planned puncture site. Using ultrasound guidance, access into the right internal jugular vein was obtained with visualization of needle entry into the vessel using a standard micropuncture technique. A wire was advanced into the IVC and serial fascial dilation performed. A 10 French tips sheath was placed into the internal jugular vein and advanced to the IVC. The jugular sheath was retracted into the  right atrium and manometry was performed. A 5 French angled tip catheter was then directed into the right hepatic vein. Hepatic venogram was performed. These images demonstrated patent hepatic vein with no stenosis. The catheter was advanced to a wedge portion of the a patent vein over which the 10 French sheath was advanced into the right hepatic vein. Using ICE ultrasound visualization the catheter as right hepatic vein as well as the portal anatomy was defined. A planned exit site from the hepatic vein and puncture site from the portal vein was placed into a single sonographic plane. Under direct ultrasound visualization, the ScorpionX needle was advanced into the central aspect of the posterior division of the right portal vein. A Glidewire Advantage was then advanced into the splenic vein. A 5 French marking pigtail catheter was then advanced over the wire into the main portal vein and wire removed. Portal venogram was performed which demonstrated a patent portal vein. Portal manometry was then performed. The  tract was then dilated to 8 mm with an 8 mm x 8 cm Athletis balloon. A 8-10 mm by 7 + 2 cm of Viatorr endograft was placed. This was ultimately dilated to 8 mm. After placement of the shunt, right atrial and portal pressures were repeated. Completion portal venogram demonstrates a patent TIPS endograft. Utilizing a C2 catheter, the posterior gastric vein was catheterized and contrast injection was performed demonstrating contribution to multiple hypertrophied varices. As such, with the use of a Navicross catheter, multiple overlapping 6 mm diameter [place] 0.035 Coils were deployed to near the vessel's confluence with the splenic vein. Next, the C2 catheter was utilized to select the left gastric vein and contrast injection was performed demonstrating contribution to multiple hypertrophied varices. As such, multiple overlapping 10 mm diameter [place] 0.035 Coils were deployed to near the vessel's confluence with the splenic vein. The C2 catheter was then exchanged over the [place] catheter for a measuring pigtail catheter and completion splenic and portal venograms were performed The catheters and sheath were removed and manual compression was applied to the right internal jugular and right common femoral venous access sites until hemostasis was achieved. The patient was transferred to the PACU in stable condition. Pre-TIPS Mean Pressures (mmHg): Right atrium: 20 Portal vein: 35 Portosystemic gradient: 15 Post-TIPS Mean Pressures (mmHg): Right atrium: 35 Portal vein: 28 Portosystemic gradient: 6 IMPRESSION: 1. Successful transjugular portosystemic shunt creation with reduction of portosystemic gradient from 15 mmHg to 6 mmHg. 2. Successful percutaneous coil embolization the left gastric and posterior gastric veins supplying multiple hypertrophied varices. PLAN: - continued resuscitative measures as per the ICU staff. - Repeat endoscopy may be performed at the discretion of the GI service. - The patient will be  seen at the interventional radiology clinic in approximately 4 weeks with postprocedural tips Doppler ultrasound, CMP, INR and ammonia levels. Electronically Signed   By: Sandi Mariscal M.D.   On: 03/02/2022 17:22   IR EMBO VENOUS NOT HEMORR HEMANG  INC GUIDE ROADMAPPING  Result Date: 03/02/2022 CLINICAL DATA:  History of cirrhosis, admitted with upper GI variceal bleeding, which has persisted despite endoscopy with variceal banding. Patient presents now for image guided TIPS creation with potential variceal embolization. EXAM: 1. Ultrasound-guided access of the right internal jugular vein 2. Ultrasound-guided access of the right common femoral vein 3. Hepatic venogram 4. Intravascular ultrasound 5. Catheterization of the portal vein 6. Portal venous and central manometry 7. Portal venogram 8. Creation of a transhepatic portal vein to hepatic vein shunt  9. Fluoroscopic guided percutaneous coil embolization of gastric varices x2 MEDICATIONS: As antibiotic prophylaxis, Rocephin 2 gm IV was ordered pre-procedure and administered intravenously within one hour of incision. ANESTHESIA/SEDATION: General - as administered by the Anesthesia department CONTRAST:  150 cc ML Omnipaque 300, intravenous COMPARISON:  BRT0 protocol CT scan-02/27/2022 FLUOROSCOPY TIME:  28 minutes, 6 seconds (4,825 mGy) COMPLICATIONS: None immediate. PROCEDURE: Informed written consent was obtained from the patient and the patient's family (via the use of a Forensic scientist) after a thorough discussion of the procedural risks, benefits and alternatives. All questions were addressed. Maximal Sterile Barrier Technique was utilized including caps, mask, sterile gowns, sterile gloves, sterile drape, hand hygiene and skin antiseptic. A timeout was performed prior to the initiation of the procedure. A preliminary ultrasound of the right groin was performed and demonstrates a patent right common femoral vein. A permanent ultrasound image was recorded.  Using a combination of fluoroscopy and ultrasound, an access site was determined. A small dermatotomy was made at the planned puncture site. Using ultrasound guidance, access into the right common femoral vein was obtained with visualization of needle entry into the vessel using a standard micropuncture technique. A wire was advanced into the IVC insert all fascial dilation performed. An 8 [place], 35 cm vascular sheath was placed into the IVC. Through this access site, an 32 [place] ICE catheter was advanced with ease under fluoroscopic guidance to the level of the intrahepatic inferior vena cava. A preliminary ultrasound of the right neck was performed and demonstrates a patent internal jugular vein. A permanent ultrasound image was recorded. Using a combination of fluoroscopy and ultrasound, an access site was determined. A small dermatotomy was made at the planned puncture site. Using ultrasound guidance, access into the right internal jugular vein was obtained with visualization of needle entry into the vessel using a standard micropuncture technique. A wire was advanced into the IVC and serial fascial dilation performed. A 10 French tips sheath was placed into the internal jugular vein and advanced to the IVC. The jugular sheath was retracted into the right atrium and manometry was performed. A 5 French angled tip catheter was then directed into the right hepatic vein. Hepatic venogram was performed. These images demonstrated patent hepatic vein with no stenosis. The catheter was advanced to a wedge portion of the a patent vein over which the 10 French sheath was advanced into the right hepatic vein. Using ICE ultrasound visualization the catheter as right hepatic vein as well as the portal anatomy was defined. A planned exit site from the hepatic vein and puncture site from the portal vein was placed into a single sonographic plane. Under direct ultrasound visualization, the ScorpionX needle was advanced  into the central aspect of the posterior division of the right portal vein. A Glidewire Advantage was then advanced into the splenic vein. A 5 French marking pigtail catheter was then advanced over the wire into the main portal vein and wire removed. Portal venogram was performed which demonstrated a patent portal vein. Portal manometry was then performed. The tract was then dilated to 8 mm with an 8 mm x 8 cm Athletis balloon. A 8-10 mm by 7 + 2 cm of Viatorr endograft was placed. This was ultimately dilated to 8 mm. After placement of the shunt, right atrial and portal pressures were repeated. Completion portal venogram demonstrates a patent TIPS endograft. Utilizing a C2 catheter, the posterior gastric vein was catheterized and contrast injection was performed demonstrating contribution to multiple hypertrophied  varices. As such, with the use of a Navicross catheter, multiple overlapping 6 mm diameter Azur 0.035 Coils were deployed to near the vessel's confluence with the splenic vein. Next, the C2 catheter was utilized to select the left gastric vein and contrast injection was performed demonstrating contribution to multiple hypertrophied varices. As such, multiple overlapping 10 mm diameter Azur 0.035 Coils were deployed to near the vessel's confluence with the splenic vein. The C2 catheter was then exchanged over the [place] catheter for a measuring pigtail catheter and completion splenic and portal venograms were performed The catheters and sheath were removed and manual compression was applied to the right internal jugular and right common femoral venous access sites until hemostasis was achieved. The patient was transferred to the PACU in stable condition. Pre-TIPS Mean Pressures (mmHg): Right atrium: 20 Portal vein: 35 Portosystemic gradient: 15 Post-TIPS Mean Pressures (mmHg): Right atrium: 35 Portal vein: 28 Portosystemic gradient: 6 IMPRESSION: 1. Successful transjugular portosystemic shunt  creation with reduction of portosystemic gradient from 15 mmHg to 6 mmHg. 2. Successful percutaneous coil embolization the left gastric and posterior gastric veins supplying multiple hypertrophied varices. PLAN: - continued resuscitative measures as per the ICU staff. - Repeat endoscopy may be performed at the discretion of the GI service. - The patient will be seen at the interventional radiology clinic in approximately 4 weeks with postprocedural tips Doppler ultrasound, CMP, INR and ammonia levels. Electronically Signed   By: Sandi Mariscal M.D.   On: 03/02/2022 17:22   IR US Guide Vasc Access Right  Result Date: 03/02/2022 CLINICAL DATA:  History of cirrhosis, admitted with upper GI variceal bleeding, which has persisted despite endoscopy with variceal banding. Patient presents now for image guided TIPS creation with potential variceal embolization. EXAM: 1. Ultrasound-guided access of the right internal jugular vein 2. Ultrasound-guided access of the right common femoral vein 3. Hepatic venogram 4. Intravascular ultrasound 5. Catheterization of the portal vein 6. Portal venous and central manometry 7. Portal venogram 8. Creation of a transhepatic portal vein to hepatic vein shunt 9. Fluoroscopic guided percutaneous coil embolization of gastric varices x2 MEDICATIONS: As antibiotic prophylaxis, Rocephin 2 gm IV was ordered pre-procedure and administered intravenously within one hour of incision. ANESTHESIA/SEDATION: General - as administered by the Anesthesia department CONTRAST:  150 cc ML Omnipaque 300, intravenous COMPARISON:  BRT0 protocol CT scan-02/27/2022 FLUOROSCOPY TIME:  28 minutes, 6 seconds (3,545 mGy) COMPLICATIONS: None immediate. PROCEDURE: Informed written consent was obtained from the patient and the patient's family (via the use of a Forensic scientist) after a thorough discussion of the procedural risks, benefits and alternatives. All questions were addressed. Maximal Sterile Barrier  Technique was utilized including caps, mask, sterile gowns, sterile gloves, sterile drape, hand hygiene and skin antiseptic. A timeout was performed prior to the initiation of the procedure. A preliminary ultrasound of the right groin was performed and demonstrates a patent right common femoral vein. A permanent ultrasound image was recorded. Using a combination of fluoroscopy and ultrasound, an access site was determined. A small dermatotomy was made at the planned puncture site. Using ultrasound guidance, access into the right common femoral vein was obtained with visualization of needle entry into the vessel using a standard micropuncture technique. A wire was advanced into the IVC insert all fascial dilation performed. An 8 [place], 35 cm vascular sheath was placed into the IVC. Through this access site, an 80 [place] ICE catheter was advanced with ease under fluoroscopic guidance to the level of the intrahepatic  inferior vena cava. A preliminary ultrasound of the right neck was performed and demonstrates a patent internal jugular vein. A permanent ultrasound image was recorded. Using a combination of fluoroscopy and ultrasound, an access site was determined. A small dermatotomy was made at the planned puncture site. Using ultrasound guidance, access into the right internal jugular vein was obtained with visualization of needle entry into the vessel using a standard micropuncture technique. A wire was advanced into the IVC and serial fascial dilation performed. A 10 French tips sheath was placed into the internal jugular vein and advanced to the IVC. The jugular sheath was retracted into the right atrium and manometry was performed. A 5 French angled tip catheter was then directed into the right hepatic vein. Hepatic venogram was performed. These images demonstrated patent hepatic vein with no stenosis. The catheter was advanced to a wedge portion of the a patent vein over which the 10 French sheath was  advanced into the right hepatic vein. Using ICE ultrasound visualization the catheter as right hepatic vein as well as the portal anatomy was defined. A planned exit site from the hepatic vein and puncture site from the portal vein was placed into a single sonographic plane. Under direct ultrasound visualization, the ScorpionX needle was advanced into the central aspect of the posterior division of the right portal vein. A Glidewire Advantage was then advanced into the splenic vein. A 5 French marking pigtail catheter was then advanced over the wire into the main portal vein and wire removed. Portal venogram was performed which demonstrated a patent portal vein. Portal manometry was then performed. The tract was then dilated to 8 mm with an 8 mm x 8 cm Athletis balloon. A 8-10 mm by 7 + 2 cm of Viatorr endograft was placed. This was ultimately dilated to 8 mm. After placement of the shunt, right atrial and portal pressures were repeated. Completion portal venogram demonstrates a patent TIPS endograft. Utilizing a C2 catheter, the posterior gastric vein was catheterized and contrast injection was performed demonstrating contribution to multiple hypertrophied varices. As such, with the use of a Navicross catheter, multiple overlapping 6 mm diameter Azur 0.035 Coils were deployed to near the vessel's confluence with the splenic vein. Next, the C2 catheter was utilized to select the left gastric vein and contrast injection was performed demonstrating contribution to multiple hypertrophied varices. As such, multiple overlapping 10 mm diameter Azur 0.035 Coils were deployed to near the vessel's confluence with the splenic vein. The C2 catheter was then exchanged over the Hinckley catheter for a measuring pigtail catheter and completion splenic and portal venograms were performed The catheters and sheath were removed and manual compression was applied to the right internal jugular and right common femoral venous  access sites until hemostasis was achieved. The patient was transferred to the PACU in stable condition. Pre-TIPS Mean Pressures (mmHg): Right atrium: 20 Portal vein: 35 Portosystemic gradient: 15 Post-TIPS Mean Pressures (mmHg): Right atrium: 35 Portal vein: 28 Portosystemic gradient: 6 IMPRESSION: 1. Successful transjugular portosystemic shunt creation with reduction of portosystemic gradient from 15 mmHg to 6 mmHg. 2. Successful percutaneous coil embolization the left gastric and posterior gastric veins supplying multiple hypertrophied varices. PLAN: - continued resuscitative measures as per the ICU staff. - Repeat endoscopy may be performed at the discretion of the GI service. - The patient will be seen at the interventional radiology clinic in approximately 4 weeks with postprocedural tips Doppler ultrasound, CMP, INR and ammonia levels. Electronically Signed   By:  Sandi Mariscal M.D.   On: 03/02/2022 17:22   IR IVUS EACH ADDITIONAL NON CORONARY VESSEL  Result Date: 03/02/2022 CLINICAL DATA:  History of cirrhosis, admitted with upper GI variceal bleeding, which has persisted despite endoscopy with variceal banding. Patient presents now for image guided TIPS creation with potential variceal embolization. EXAM: 1. Ultrasound-guided access of the right internal jugular vein 2. Ultrasound-guided access of the right common femoral vein 3. Hepatic venogram 4. Intravascular ultrasound 5. Catheterization of the portal vein 6. Portal venous and central manometry 7. Portal venogram 8. Creation of a transhepatic portal vein to hepatic vein shunt 9. Fluoroscopic guided percutaneous coil embolization of gastric varices x2 MEDICATIONS: As antibiotic prophylaxis, Rocephin 2 gm IV was ordered pre-procedure and administered intravenously within one hour of incision. ANESTHESIA/SEDATION: General - as administered by the Anesthesia department CONTRAST:  150 cc ML Omnipaque 300, intravenous COMPARISON:  BRT0 protocol CT  scan-02/27/2022 FLUOROSCOPY TIME:  28 minutes, 6 seconds (1,607 mGy) COMPLICATIONS: None immediate. PROCEDURE: Informed written consent was obtained from the patient and the patient's family (via the use of a Forensic scientist) after a thorough discussion of the procedural risks, benefits and alternatives. All questions were addressed. Maximal Sterile Barrier Technique was utilized including caps, mask, sterile gowns, sterile gloves, sterile drape, hand hygiene and skin antiseptic. A timeout was performed prior to the initiation of the procedure. A preliminary ultrasound of the right groin was performed and demonstrates a patent right common femoral vein. A permanent ultrasound image was recorded. Using a combination of fluoroscopy and ultrasound, an access site was determined. A small dermatotomy was made at the planned puncture site. Using ultrasound guidance, access into the right common femoral vein was obtained with visualization of needle entry into the vessel using a standard micropuncture technique. A wire was advanced into the IVC insert all fascial dilation performed. An 8 [place], 35 cm vascular sheath was placed into the IVC. Through this access site, an 70 [place] ICE catheter was advanced with ease under fluoroscopic guidance to the level of the intrahepatic inferior vena cava. A preliminary ultrasound of the right neck was performed and demonstrates a patent internal jugular vein. A permanent ultrasound image was recorded. Using a combination of fluoroscopy and ultrasound, an access site was determined. A small dermatotomy was made at the planned puncture site. Using ultrasound guidance, access into the right internal jugular vein was obtained with visualization of needle entry into the vessel using a standard micropuncture technique. A wire was advanced into the IVC and serial fascial dilation performed. A 10 French tips sheath was placed into the internal jugular vein and advanced to the IVC.  The jugular sheath was retracted into the right atrium and manometry was performed. A 5 French angled tip catheter was then directed into the right hepatic vein. Hepatic venogram was performed. These images demonstrated patent hepatic vein with no stenosis. The catheter was advanced to a wedge portion of the a patent vein over which the 10 French sheath was advanced into the right hepatic vein. Using ICE ultrasound visualization the catheter as right hepatic vein as well as the portal anatomy was defined. A planned exit site from the hepatic vein and puncture site from the portal vein was placed into a single sonographic plane. Under direct ultrasound visualization, the ScorpionX needle was advanced into the central aspect of the posterior division of the right portal vein. A Glidewire Advantage was then advanced into the splenic vein. A 5 French marking pigtail catheter was  then advanced over the wire into the main portal vein and wire removed. Portal venogram was performed which demonstrated a patent portal vein. Portal manometry was then performed. The tract was then dilated to 8 mm with an 8 mm x 8 cm Athletis balloon. A 8-10 mm by 7 + 2 cm of Viatorr endograft was placed. This was ultimately dilated to 8 mm. After placement of the shunt, right atrial and portal pressures were repeated. Completion portal venogram demonstrates a patent TIPS endograft. Utilizing a C2 catheter, the posterior gastric vein was catheterized and contrast injection was performed demonstrating contribution to multiple hypertrophied varices. As such, with the use of a Navicross catheter, multiple overlapping 6 mm diameter Azur 0.035 Coils were deployed to near the vessel's confluence with the splenic vein. Next, the C2 catheter was utilized to select the left gastric vein and contrast injection was performed demonstrating contribution to multiple hypertrophied varices. As such, multiple overlapping 10 mm diameter Azur 0.035 Coils were  deployed to near the vessel's confluence with the splenic vein. The C2 catheter was then exchanged over the [place] catheter for a measuring pigtail catheter and completion splenic and portal venograms were performed The catheters and sheath were removed and manual compression was applied to the right internal jugular and right common femoral venous access sites until hemostasis was achieved. The patient was transferred to the PACU in stable condition. Pre-TIPS Mean Pressures (mmHg): Right atrium: 20 Portal vein: 35 Portosystemic gradient: 15 Post-TIPS Mean Pressures (mmHg): Right atrium: 35 Portal vein: 28 Portosystemic gradient: 6 IMPRESSION: 1. Successful transjugular portosystemic shunt creation with reduction of portosystemic gradient from 15 mmHg to 6 mmHg. 2. Successful percutaneous coil embolization the left gastric and posterior gastric veins supplying multiple hypertrophied varices. PLAN: - continued resuscitative measures as per the ICU staff. - Repeat endoscopy may be performed at the discretion of the GI service. - The patient will be seen at the interventional radiology clinic in approximately 4 weeks with postprocedural tips Doppler ultrasound, CMP, INR and ammonia levels. Electronically Signed   By: Sandi Mariscal M.D.   On: 03/02/2022 17:22   ECHOCARDIOGRAM COMPLETE  Result Date: 02/28/2022    ECHOCARDIOGRAM REPORT   Patient Name:   Daniel Harmon Date of Exam: 02/28/2022 Medical Rec #:  [id number]   Height:       66.1 in Accession #:    [id number]  Weight:       220.0 lb Date of Birth:  01-12-1971   BSA:          2.086 m Patient Age:    79 years    BP:           108/55 mmHg Patient Gender: M           HR:           95 bpm. Exam Location:  Inpatient Procedure: 2D Echo, 3D Echo, Cardiac Doppler and Color Doppler Indications:    Alcoholic cirrhosis of liver ([place]) [571.2.ICD-9-CM]  History:        Patient has no prior history of Echocardiogram examinations.                 Risk Factors:Former  Smoker.  Sonographer:    Greer Pickerel Referring Phys: [phone] [facility]  Sonographer Comments: Suboptimal subcostal window and suboptimal parasternal window. Image acquisition challenging due to respiratory motion and Image acquisition challenging due to patient body habitus. IMPRESSIONS  1. Left ventricular ejection fraction, by estimation, is >75%. Left ventricular ejection  fraction by 3D volume is 77 %. The left ventricle has hyperdynamic function. The left ventricle has no regional wall motion abnormalities. There is mild concentric left ventricular hypertrophy. Left ventricular diastolic parameters are consistent with Grade I diastolic dysfunction (impaired relaxation).  2. Right ventricular systolic function is normal. The right ventricular size is normal. There is normal pulmonary artery systolic pressure.  3. The mitral valve is normal in structure. Trivial mitral valve regurgitation.  4. The aortic valve is tricuspid. Aortic valve regurgitation is not visualized. No aortic stenosis is present.  5. Aortic dilatation noted. There is borderline dilatation of the aortic root, measuring 37 mm.  6. The inferior vena cava is normal in size with greater than 50% respiratory variability, suggesting right atrial pressure of 3 mmHg. Comparison(s): No prior Echocardiogram. FINDINGS  Left Ventricle: Left ventricular ejection fraction, by estimation, is >75%. Left ventricular ejection fraction by 3D volume is 77 %. The left ventricle has hyperdynamic function. The left ventricle has no regional wall motion abnormalities. The left ventricular internal cavity size was normal in size. There is mild concentric left ventricular hypertrophy. Left ventricular diastolic parameters are consistent with Grade I diastolic dysfunction (impaired relaxation). Right Ventricle: The right ventricular size is normal. No increase in right ventricular wall thickness. Right ventricular systolic function is normal. There is normal  pulmonary artery systolic pressure. The tricuspid regurgitant velocity is 2.00 m/s, and  with an assumed right atrial pressure of 8 mmHg, the estimated right ventricular systolic pressure is 16.0 mmHg. Left Atrium: Left atrial size was normal in size. Right Atrium: Right atrial size was normal in size. Pericardium: There is no evidence of pericardial effusion. Mitral Valve: The mitral valve is normal in structure. Trivial mitral valve regurgitation. Tricuspid Valve: The tricuspid valve is normal in structure. Tricuspid valve regurgitation is trivial. Aortic Valve: There are elevated gradients across the aortic valve likely due to high output state with no evidence of aortic stenosis. The aortic valve is tricuspid. Aortic valve regurgitation is not visualized. No aortic stenosis is present. Aortic valve mean gradient measures 11.5 mmHg. Aortic valve peak gradient measures 18.7 mmHg. Aortic valve area, by VTI measures 3.80 cm. Pulmonic Valve: The pulmonic valve was normal in structure. Pulmonic valve regurgitation is trivial. Aorta: Aortic dilatation noted. There is borderline dilatation of the aortic root, measuring 37 mm. Venous: The inferior vena cava is normal in size with greater than 50% respiratory variability, suggesting right atrial pressure of 3 mmHg. IAS/Shunts: The atrial septum is grossly normal.  LEFT VENTRICLE PLAX 2D LVIDd:         4.60 cm         Diastology LVIDs:         2.80 cm         LV e' medial:    9.32 cm/s LV PW:         1.30 cm         LV E/e' medial:  9.5 LV IVS:        1.30 cm         LV e' lateral:   14.50 cm/s LVOT diam:     2.30 cm         LV E/e' lateral: 6.1 LV SV:         132 LV SV Index:   63 LVOT Area:     4.15 cm        3D Volume EF  LV 3D EF:    Left                                             ventricul                                             ar                                             ejection                                              fraction                                             by 3D                                             volume is                                             77 %.                                 3D Volume EF:                                3D EF:        77 %                                LV EDV:       127 ml                                LV ESV:       30 ml                                LV SV:        97 ml RIGHT VENTRICLE RV S prime:     23.10 cm/s TAPSE (M-mode): 3.0 cm LEFT ATRIUM             Index        RIGHT ATRIUM           Index LA diam:        4.00 cm 1.92 cm/m   RA Area:     25.70 cm  LA Vol (A2C):   26.0 ml 12.46 ml/m  RA Volume:   80.90 ml  38.78 ml/m LA Vol (A4C):   62.3 ml 29.86 ml/m LA Biplane Vol: 44.2 ml 21.19 ml/m  AORTIC VALVE AV Area (Vmax):    3.67 cm AV Area (Vmean):   3.28 cm AV Area (VTI):     3.80 cm AV Vmax:           216.00 cm/s AV Vmean:          159.500 cm/s AV VTI:            0.348 m AV Peak Grad:      18.7 mmHg AV Mean Grad:      11.5 mmHg LVOT Vmax:         191.00 cm/s LVOT Vmean:        126.000 cm/s LVOT VTI:          0.318 m LVOT/AV VTI ratio: 0.91  AORTA Ao Root diam: 3.70 cm Ao Asc diam:  3.20 cm MITRAL VALVE               TRICUSPID VALVE MV Area (PHT): 3.21 cm    TR Peak grad:   16.0 mmHg MV Decel Time: 236 msec    TR Vmax:        200.00 cm/s MV E velocity: 88.10 cm/s MV A velocity: 97.90 cm/s  SHUNTS MV E/A ratio:  0.90        Systemic VTI:  0.32 m                            Systemic Diam: 2.30 cm Gwyndolyn Kaufman MD Electronically signed by Gwyndolyn Kaufman MD Signature Date/Time: 02/28/2022/4:26:40 PM    Final    CT ANGIO GI BLEED  Result Date: 02/27/2022 CLINICAL DATA:  Hematochezia EXAM: CTA ABDOMEN AND PELVIS WITHOUT AND WITH CONTRAST TECHNIQUE: Multidetector CT imaging of the abdomen and pelvis was performed using the standard protocol during bolus administration of intravenous contrast. Multiplanar reconstructed images and MIPs were obtained and reviewed  to evaluate the vascular anatomy. RADIATION DOSE REDUCTION: This exam was performed according to the departmental dose-optimization program which includes automated exposure control, adjustment of the mA and/or kV according to patient size and/or use of iterative reconstruction technique. CONTRAST:  168m OMNIPAQUE IOHEXOL 350 MG/ML SOLN COMPARISON:  None Available. FINDINGS: VASCULAR Aorta: Initial precontrast images demonstrate no hyperdense crescent to suggest acute aortic injury. Post-contrast images demonstrate no evidence of aneurysmal dilatation or dissection. Celiac: Patent without evidence of aneurysm, dissection, vasculitis or significant stenosis. SMA: Patent without evidence of aneurysm, dissection, vasculitis or significant stenosis. Renals: Dual renal arteries are noted bilaterally. No focal stenoses are seen. IMA: Patent without evidence of aneurysm, dissection, vasculitis or significant stenosis. Inflow: Iliacs are well visualized and within normal limits. Veins: Esophageal and perisplenic varices are noted. Review of the MIP images confirms the above findings. NON-VASCULAR Lower chest: The lung bases are free of acute infiltrate or sizable effusion. Hepatobiliary: Liver demonstrates diffuse nodularity consistent with underlying cirrhosis. A small 1 cm hypodensity is noted in the right lobe near the dome of the diaphragm best seen on image number 9 of series 11. This is too small for adequate characterization but likely represents a small cyst. Pancreas: Unremarkable. No pancreatic ductal dilatation or surrounding inflammatory changes. Spleen: Spleen is within normal limits. Adrenals/Urinary Tract: Adrenal glands are unremarkable. Kidneys demonstrate a normal enhancement pattern bilaterally. No renal  calculi or obstructive changes are seen. The bladder is partially distended. Stomach/Bowel: Scattered diverticular change of the colon is noted without evidence of diverticulitis. The appendix is well  visualized and within normal limits. No pooling of contrast in the colon is identified to suggest acute colonic hemorrhage. Multiple somewhat nodular areas of increased attenuation are noted in the distal esophagus and GE junction consistent with small varices. No pooling in the small bowel is noted to suggest acute small-bowel hemorrhage. Lymphatic: No sizable lymphadenopathy is noted. Reproductive: Prostate is unremarkable. Other: No abdominal wall hernia or abnormality. No abdominopelvic ascites. Musculoskeletal: No acute bony abnormality noted. IMPRESSION: VASCULAR No acute arterial or venous abnormality is noted. No findings to suggest acute GI hemorrhage are seen. Esophageal and splenic varices. NON-VASCULAR Diverticulosis without diverticulitis. Cirrhotic change of the liver with subsequent sequelae of portal hypertension. No evidence of acute GI hemorrhage is seen. Small hypodensity in the right lobe of the liver, too small for characterization but likely representing a small cyst. Electronically Signed   By: Inez Catalina M.D.   On: 02/27/2022 20:58   US Abdomen Complete  Result Date: 02/27/2022 CLINICAL DATA:  Portal hypertension EXAM: ABDOMEN ULTRASOUND COMPLETE COMPARISON:  None Available. FINDINGS: Gallbladder: No gallstones or wall thickening visualized. No sonographic Murphy sign noted by sonographer. Common bile duct: Diameter: 4 mm. Liver: No focal lesion identified. Nodular hepatic surface contour with coarsened, heterogeneous echotexture. Portal vein is patent on color Doppler imaging with normal direction of blood flow towards the liver. IVC: No abnormality visualized. Pancreas: Visualized portion unremarkable. Spleen: Size and appearance within normal limits. Right Kidney: Length: 11.4 cm. Echogenicity within normal limits. No mass or hydronephrosis visualized. Left Kidney: Length: 11.7 cm. Echogenicity within normal limits. No mass or hydronephrosis visualized. Abdominal aorta: No aneurysm  visualized. Other findings: None. IMPRESSION: 1. Cirrhotic appearance of the liver without focal lesion. 2. Remainder of the examination is within normal limits. Electronically Signed   By: Davina Poke D.O.   On: 02/27/2022 11:35      Subjective: Significant events overnight, he denies any complaints, he had multiple bowel movements light brown in color, no melena or blood in stools.  Discharge Exam: Vitals:   03/05/22 0340 03/05/22 0800  BP: 102/60 (!) 107/58  Pulse: 77   Resp: 19 19  Temp: 98.6 F (37 C) 99.3 F (37.4 C)  SpO2: 97%    Vitals:   03/05/22 0040 03/05/22 0340 03/05/22 0700 03/05/22 0800  BP: (!) 110/55 102/60  (!) 107/58  Pulse: 77 77    Resp: 18 19  19   Temp: 99 F (37.2 C) 98.6 F (37 C)  99.3 F (37.4 C)  TempSrc: Oral Oral  Oral  SpO2:  97%    Weight:      Height:   5' 6"  (1.676 m)     General: Pt is alert, awake, not in acute distress Cardiovascular: RRR, S1/S2 +, no rubs, no gallops Respiratory: CTA bilaterally, no wheezing, no rhonchi Abdominal: Soft, NT, ND, bowel sounds + Extremities: no edema, no cyanosis    The results of significant diagnostics from this hospitalization (including imaging, microbiology, ancillary and laboratory) are listed below for reference.     Microbiology: Recent Results (from the past 240 hour(s))  MRSA Next Gen by PCR, Nasal     Status: None   Collection Time: 02/27/22  8:54 PM   Specimen: Nasal Mucosa; Nasal Swab  Result Value Ref Range Status   MRSA by PCR Next Gen NOT DETECTED  NOT DETECTED Final    Comment: (NOTE) The GeneXpert MRSA Assay (FDA approved for NASAL specimens only), is one component of a comprehensive MRSA colonization surveillance program. It is not intended to diagnose MRSA infection nor to guide or monitor treatment for MRSA infections. Test performance is not FDA approved in patients less than 55 years old. Performed at [facility], [place] [address] [place], [place] [address]   Surgical pcr screen     Status: None   Collection Time: 02/28/22  1:12 AM   Specimen: Nasal Mucosa; Nasal Swab  Result Value Ref Range Status   MRSA, PCR NEGATIVE NEGATIVE Final   Staphylococcus aureus NEGATIVE NEGATIVE Final    Comment: (NOTE) The Xpert SA Assay (FDA approved for NASAL specimens in patients 60 years of age and older), is one component of a comprehensive surveillance program. It is not intended to diagnose infection nor to guide or monitor treatment. Performed at [facility], [place] [address] [place], [place] [address]      Labs: BNP (last 3 results) No results for input(s): "BNP" in the last 8760 hours. Basic Metabolic Panel: Recent Labs  Lab 02/28/22 0403 03/01/22 0340 03/02/22 0556 03/02/22 1413 03/03/22 0117 03/05/22 0431  NA 142 143 138 138 135 135  K 3.9 3.6 3.9 3.7 4.0 3.6  CL 113* 113* 108  --  103 106  CO2 22 26 24   --  21* 20*  GLUCOSE 132* 115* 99  --  142* 97  BUN 28* 20 11  --  9 12  CREATININE 1.07 0.98 0.90  --  0.94 1.00  CALCIUM 7.3* 7.3* 7.6*  --  7.5* 7.8*  MG 1.8  --  1.8  --  1.8  --    Liver Function Tests: Recent Labs  Lab 02/27/22 0445 02/27/22 1658 03/02/22 0556 03/03/22 0117 03/05/22 0431  AST 34  --  59* 62* 107*  ALT 33  --  49* 65* 126*  ALKPHOS 66  --  39 52 82  BILITOT 1.6* 1.2 1.1 1.3* 1.6*  PROT 6.3*  --  3.9* 4.7* 4.7*  ALBUMIN 3.5  --  2.2* 2.5* 2.5*   No results for input(s): "LIPASE", "AMYLASE" in the last 168 hours. No results for input(s): "AMMONIA" in the last 168 hours. CBC: Recent Labs  Lab 02/27/22 0447 02/27/22 1020 03/01/22 0340 03/01/22 1039 03/02/22 0556 03/02/22 1413 03/02/22 2204 03/03/22 0117 03/04/22 0835 03/05/22 0431  WBC 10.8*   < > 9.5  --  5.3  --   --  9.3 10.0 7.6  NEUTROABS 8.3*  --   --   --  3.1  --   --  8.0*  --   --   HGB 11.1*   < > 7.3*   < > 8.5* 7.5* 9.8* 9.8* 10.2* 9.5*  HCT 32.1*   < > 21.2*   < > 23.2* 22.0* 28.0* 27.1*  28.6* 27.6*  MCV 97.0   < > 90.6  --  89.2  --   --  87.7 89.4 90.8  PLT 138*   < > 65*  --  59*  --   --  101* 105* 95*   < > = values in this interval not displayed.   Cardiac Enzymes: No results for input(s): "CKTOTAL", "CKMB", "CKMBINDEX", "TROPONINI" in the last 168 hours. BNP: Invalid input(s): "POCBNP" CBG: Recent Labs  Lab 03/03/22 2016 03/03/22 2345 03/04/22 0433 03/04/22 0754 03/04/22 1157  GLUCAP 93 106* 93  82 118*   D-Dimer No results for input(s): "DDIMER" in the last 72 hours. Hgb A1c No results for input(s): "HGBA1C" in the last 72 hours. Lipid Profile No results for input(s): "CHOL", "HDL", "LDLCALC", "TRIG", "CHOLHDL", "LDLDIRECT" in the last 72 hours. Thyroid function studies No results for input(s): "TSH", "T4TOTAL", "T3FREE", "THYROIDAB" in the last 72 hours.  Invalid input(s): "FREET3" Anemia work up No results for input(s): "VITAMINB12", "FOLATE", "FERRITIN", "TIBC", "IRON", "RETICCTPCT" in the last 72 hours. Urinalysis    Component Value Date/Time   COLORURINE YELLOW 02/04/2010 2308   APPEARANCEUR CLEAR 02/04/2010 2308   LABSPEC 1.029 02/04/2010 2308   PHURINE 6.0 02/04/2010 2308   GLUCOSEU NEGATIVE 02/04/2010 2308   HGBUR NEGATIVE 02/04/2010 2308   BILIRUBINUR NEGATIVE 02/04/2010 2308   KETONESUR 15 (A) 02/04/2010 2308   PROTEINUR NEGATIVE 02/04/2010 2308   UROBILINOGEN 1.0 02/04/2010 2308   NITRITE NEGATIVE 02/04/2010 2308   LEUKOCYTESUR SMALL (A) 02/04/2010 2308   Sepsis Labs Recent Labs  Lab 03/02/22 0556 03/03/22 0117 03/04/22 0835 03/05/22 0431  WBC 5.3 9.3 10.0 7.6   Microbiology Recent Results (from the past 240 hour(s))  MRSA Next Gen by PCR, Nasal     Status: None   Collection Time: 02/27/22  8:54 PM   Specimen: Nasal Mucosa; Nasal Swab  Result Value Ref Range Status   MRSA by PCR Next Gen NOT DETECTED NOT DETECTED Final    Comment: (NOTE) The GeneXpert MRSA Assay (FDA approved for NASAL specimens only), is one  component of a comprehensive MRSA colonization surveillance program. It is not intended to diagnose MRSA infection nor to guide or monitor treatment for MRSA infections. Test performance is not FDA approved in patients less than 73 years old. Performed at [facility], [place] [address] [place], [place] [address]   Surgical pcr screen     Status: None   Collection Time: 02/28/22  1:12 AM   Specimen: Nasal Mucosa; Nasal Swab  Result Value Ref Range Status   MRSA, PCR NEGATIVE NEGATIVE Final   Staphylococcus aureus NEGATIVE NEGATIVE Final    Comment: (NOTE) The Xpert SA Assay (FDA approved for NASAL specimens in patients 63 years of age and older), is one component of a comprehensive surveillance program. It is not intended to diagnose infection nor to guide or monitor treatment. Performed at [facility], [place] [address], [place], [place] [address]      Time coordinating discharge: Over 30 minutes  SIGNED:   Phillips Climes, MD  [facility] 03/05/2022, 11:20 AM Pager   If 7PM-7AM, please contact night-coverage [email or web address] Password [email or web address]

## 2022-03-05 NOTE — Progress Notes (Signed)
   03/05/22 0014  Vitals  Temp 99 F (37.2 C)  Temp Source Oral  BP (!) 106/53  MAP (mmHg) 67  BP Location Right Arm  BP Method Automatic  Patient Position (if appropriate) Lying  Pulse Rate 74  Pulse Rate Source Monitor  ECG Heart Rate 73  Resp 19  Level of Consciousness  Level of Consciousness Alert  MEWS COLOR  MEWS Score Color Green  MEWS Score  MEWS Temp 0  MEWS Systolic 0  MEWS Pulse 0  MEWS RR 0  MEWS LOC 0  MEWS Score 0   Dr.Rathore made aware of low BP.No intervention adviced at this point,will continue to monitor.

## 2022-04-05 ENCOUNTER — Inpatient Hospital Stay: Payer: Self-pay | Admitting: Physician Assistant

## 2022-04-05 NOTE — Progress Notes (Deleted)
Patient ID: Daniel Harmon, male   DOB: Feb 14, 1971, 51 y.o.   MRN: [id number]   After hospitalization for GI bleed 10/15-10/21/2023.  From discharge summary:  51 year old male with a history of cirrhosis.  He presented on 02/27/2022 with hematemesis that was of abrupt onset no recent ethanol use according to chart review but has admitted to using 14 standard drinks of alcohol per week.  Last drink not known.  Denies any drug use.  He was using nonsteroidal anti-inflammatory drugs.  He is a previous smoker having quit 5 years ago.  On 02/27/2022 underwent upper endoscopy by Dr. Randel Pigg of [facility] with variceal banding.  History of bleeding esophageal varices in the lower one third of esophagus.  The stomach with fresh blood and large clots.  Duodenal bulb with fresh blood but no active bleeding.  Second portion of the duodenum was unremarkable.  Status post esophageal variceal band ligation x5 minutes with good hemostasis.  Started on IV PPI and IV octreotide and transferred to the floor.  Subsequently around 6 PM 02/27/2022 patient went to the bathroom and then had a large bloody bowel movement that according to the wife mostly described as melena.  Subsequent to this patient had a syncopal episode associated with significant hypotension.  Transferred to the ICU.  Initially started on  Levophed 10 mcg/min through peripheral intravenous line and systolic blood pressure 85 systolic.  CCM consulted and pt transferred to [facility] from [facility]. Initial labs with hgb 11.1, normal Cr but elevated BUN. Negative hepatitis panel.  Variceal bleeding.  Status post EGD with band ligation 10/15 with 5 bands placement.  Patient with ongoing bleeding melanotic stool.  CT angio negative for active bleeding.  S/p IR guided TIPS placement as well as percutaneous coil embolization of left gastric and posterior gastric veins 10/18 .     Acute  blood loss anemia secondary to upper GI bleed -Required 7 units PRBC transfusion during hospital  stay  -Hemoglobin remained stable over last 72 hours s/p 7 units of pRBCs.    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 MELD 14 at admit Suspected alcoholic  GI Variceal Bleed  - s/p banding  S/p IR guided TIPS placement as well as percutaneous coil embolization of left gastric and posterior gastric veins 10/18  -Treated with octreotide, IV PPI and Rocephin for 5 days - will DC on Protonix and lactulose per GI recommendation, as well financial assistance form has been filled for rifaximin   Hypocalcemia Pseudohypocalcemia. Calcium 7.5, corrected 8.7.   Hyperglycemia of Critical Illness  A1c 5.1.       Discharge Diagnoses:  Principal Problem:   Upper GI bleed Active Problems:   Alcoholic cirrhosis of liver (HCC)   Hyperglycemia   Hyperbilirubinemia   Hemorrhagic shock (HCC)

## 2022-04-13 ENCOUNTER — Encounter: Payer: Self-pay | Admitting: Nurse Practitioner

## 2022-04-13 ENCOUNTER — Ambulatory Visit (INDEPENDENT_AMBULATORY_CARE_PROVIDER_SITE_OTHER): Payer: Self-pay | Admitting: Nurse Practitioner

## 2022-04-13 VITALS — BP 123/69 | HR 78 | Temp 98.8°F | Ht 66.14 in | Wt 209.6 lb

## 2022-04-13 DIAGNOSIS — E66811 Obesity, class 1: Secondary | ICD-10-CM | POA: Insufficient documentation

## 2022-04-13 DIAGNOSIS — K922 Gastrointestinal hemorrhage, unspecified: Secondary | ICD-10-CM

## 2022-04-13 DIAGNOSIS — K7030 Alcoholic cirrhosis of liver without ascites: Secondary | ICD-10-CM

## 2022-04-13 DIAGNOSIS — E669 Obesity, unspecified: Secondary | ICD-10-CM

## 2022-04-13 NOTE — Assessment & Plan Note (Addendum)
Chronic condition, recently admitted in the hospital for upper GI bleed secondary to cirrhosis Denies hematemesis, melena today Currently followed by GI Taking lactulose 10 mg 2 times daily as needed Patient encouraged to continue to abstain from alcohol

## 2022-04-13 NOTE — Progress Notes (Signed)
New Patient Office Visit  Subjective:  Patient ID: Daniel Harmon, male    DOB: 01-17-1971  Age: 51 y.o. MRN: [id number]  CC:  Chief Complaint  Patient presents with   Establish Care    HPI Daniel Harmon is a 51 y.o. male with past medical history of cirrhosis, GI bleed,  presents for establishing care for his chronic medical conditions.  Patient was admitted to the hospital on February 27, 2022 to March 05, 2022 for acute blood loss anemia secondary to upper GI bleed.  He had presented today emergency room with complaints of hematemesis that was of abrupt onset, last alcohol drink was over 3 years ago, he quits smoking cigarettes 5 years ago.  In the hospital patient underwent upper endoscopy with variceal banding, , 5 bands were placed IR guided TIPS placement as well as percutaneous coil embolization of left gastric and posterior gastric veins 10/18 .  Is currently on lactulose 10 g 2 times daily as needed, Protonix 40 mg daily.  Patient currently denies hematemesis melena, abdominal pain, nausea, vomiting, edema.  He has followed up with GI Dr. Otis Brace at [place], next visit is going to be in January 2024 with plans for colonoscopy.  Patient stated that he noticed a little nasal bleed after taking vitamin B12, he has stopped taking vitamin B12 due to the nasal bleeding.  Not taking rifaximin due to cost, GI is aware and they are okay with the patient not taking the medication.    Due for flu vaccine and shingles vaccine, patient encouraged to consider getting the vaccines.   Records requested from GI office today  Patient is accompanied today by his wife and a Spanish medical interpreter      Past Medical History:  Diagnosis Date   Cirrhosis [facility])     Past Surgical History:  Procedure Laterality Date   ESOPHAGEAL BANDING  02/27/2022   Procedure: ESOPHAGEAL BANDING;  Surgeon: Loney Laurence, DO;  Location: [facility];  Service: Gastroenterology;;    ESOPHAGOGASTRODUODENOSCOPY N/A 02/27/2022   Procedure: ESOPHAGOGASTRODUODENOSCOPY (EGD);  Surgeon: Loney Laurence, DO;  Location: [facility];  Service: Gastroenterology;  Laterality: N/A;   IR EMBO VENOUS NOT HEMORR HEMANG  INC GUIDE ROADMAPPING  03/02/2022   IR IVUS EACH ADDITIONAL NON CORONARY VESSEL  03/02/2022   IR TIPS  03/02/2022   IR US GUIDE VASC ACCESS RIGHT  03/02/2022   IR US GUIDE VASC ACCESS RIGHT  03/03/2022   NO PAST SURGERIES     RADIOLOGY WITH ANESTHESIA N/A 03/02/2022   Procedure: TIPS;  Surgeon: Sandi Mariscal, MD;  Location: [facility];  Service: Radiology;  Laterality: N/A;    Family History  Problem Relation Age of Onset   Alcoholism Father    Colon cancer Neg Hx    Prostate cancer Neg Hx     Social History   Socioeconomic History   Marital status: Married    Spouse name: Not on file   Number of children: 3   Years of education: Not on file   Highest education level: Not on file  Occupational History   Not on file  Tobacco Use   Smoking status: Former    Types: Cigarettes    Quit date: 07/29/2016    Years since quitting: 5.7   Smokeless tobacco: Never  Vaping Use   Vaping Use: Never used  Substance and Sexual Activity   Alcohol use: Not Currently    Alcohol/week: 14.0 standard drinks of alcohol  Types: 14 Cans of beer per week    Comment: last alcohol was 3 years ago   Drug use: No   Sexual activity: Not on file  Other Topics Concern   Not on file  Social History Narrative   Lives with his wife.    Social Determinants of Health   Financial Resource Strain: Not on file  Food Insecurity: No Food Insecurity (02/27/2022)   Hunger Vital Sign    Worried About Running Out of Food in the Last Year: Never true    Ran Out of Food in the Last Year: Never true  Transportation Needs: No Transportation Needs (02/27/2022)   PRAPARE - Hydrologist (Medical): No    Lack of Transportation (Non-Medical): No  Physical Activity:  Not on file  Stress: Not on file  Social Connections: Not on file  Intimate Partner Violence: Not At Risk (02/27/2022)   Humiliation, Afraid, Rape, and Kick questionnaire    Fear of Current or Ex-Partner: No    Emotionally Abused: No    Physically Abused: No    Sexually Abused: No    ROS Review of Systems  Constitutional:  Negative for appetite change, chills, diaphoresis, fatigue and unexpected weight change.  Respiratory:  Negative for chest tightness, shortness of breath and wheezing.   Cardiovascular:  Negative for chest pain, palpitations and leg swelling.  Gastrointestinal:  Negative for abdominal distention, abdominal pain, anal bleeding, blood in stool, constipation, rectal pain and vomiting.  Skin:  Negative for color change, pallor and rash.  Neurological:  Negative for dizziness, facial asymmetry, light-headedness, numbness and headaches.  Psychiatric/Behavioral:  Negative for agitation, behavioral problems, confusion, decreased concentration and dysphoric mood.     Objective:   Today's Vitals: BP 123/69   Pulse 78   Temp 98.8 F (37.1 C)   Ht 5' 6.14" (1.68 m)   Wt 209 lb 9.6 oz (95.1 kg)   SpO2 100%   BMI 33.69 kg/m   Physical Exam Constitutional:      General: He is not in acute distress.    Appearance: He is obese. He is not ill-appearing, toxic-appearing or diaphoretic.  Eyes:     General: No scleral icterus.       Right eye: No discharge.        Left eye: No discharge.     Extraocular Movements: Extraocular movements intact.     Conjunctiva/sclera: Conjunctivae normal.  Cardiovascular:     Rate and Rhythm: Normal rate and regular rhythm.     Pulses: Normal pulses.     Heart sounds: Normal heart sounds. No murmur heard.    No friction rub. No gallop.  Pulmonary:     Effort: Pulmonary effort is normal. No respiratory distress.     Breath sounds: Normal breath sounds. No stridor. No wheezing, rhonchi or rales.  Chest:     Chest wall: No tenderness.   Abdominal:     General: There is no distension.     Palpations: Abdomen is soft. There is no mass.     Tenderness: There is no abdominal tenderness. There is no right CVA tenderness, left CVA tenderness, guarding or rebound.     Hernia: No hernia is present.  Musculoskeletal:        General: No swelling, tenderness, deformity or signs of injury.     Right lower leg: No edema.     Left lower leg: No edema.  Skin:    Capillary Refill: Capillary refill takes less  than 2 seconds.     Coloration: Skin is not jaundiced or pale.     Findings: No bruising or erythema.  Neurological:     Mental Status: He is alert and oriented to person, place, and time.     Cranial Nerves: No cranial nerve deficit.     Sensory: No sensory deficit.     Motor: No weakness.     Coordination: Coordination normal.     Gait: Gait normal.  Psychiatric:        Mood and Affect: Mood normal.        Behavior: Behavior normal.        Thought Content: Thought content normal.        Judgment: Judgment normal.     Assessment & Plan:   Problem List Items Addressed This Visit       Digestive   Alcoholic cirrhosis of liver (HCC) - Primary    Chronic condition, recently admitted in the hospital for upper GI bleed secondary to cirrhosis Denies hematemesis, melena today Currently followed by GI Taking lactulose 10 mg 2 times daily as needed Patient encouraged to continue to abstain from alcohol       Upper GI bleed    Secondary to liver cirrhosis, GI bleed now resolved status post banding 7 units of RBC was given in the hospital Patient stated that he had labs done at the GI office so no labs  done today in the office He denies hematemesis, melena Continue Protonix 40 mg daily Patient encouraged to avoid use of NSAIDs, aspirin, avoid use of OTC products containing ginseng, tumeric due to risk of bleeding.  Patient encouraged to continue to abstain from alcohol Lab Results  Component Value Date   WBC 7.6  03/05/2022   HGB 9.5 (L) 03/05/2022   HCT 27.6 (L) 03/05/2022   MCV 90.8 03/05/2022   PLT 95 (L) 03/05/2022           Other   Obesity (BMI 30.0-34.9)    Wt Readings from Last 3 Encounters:  04/13/22 209 lb 9.6 oz (95.1 kg)  03/02/22 229 lb 4.5 oz (104 kg)  11/24/19 220 lb (99.8 kg)  Would like to resume going back to the gym Patient encouraged to gradually return back to his exercise regimen Engage in regular moderate exercises at least 150 minutes weekly Counseled on low-carb diet ,importance of portion control also discussed       Outpatient Encounter Medications as of 04/13/2022  Medication Sig   lactulose (CHRONULAC) 10 GM/15ML solution Take 15 mLs (10 g total) by mouth 2 (two) times daily as needed for mild constipation.   MELATONIN PO Take 1 tablet by mouth at bedtime as needed (sleep).   pantoprazole (PROTONIX) 40 MG tablet Take 1 tablet (40 mg total) by mouth daily.   TURMERIC CURCUMIN PO Take 900 mg by mouth.   Cyanocobalamin (B-12 PO) Take 1 tablet by mouth daily. (Patient not taking: Reported on 04/13/2022)   OVER THE COUNTER MEDICATION Take 1 capsule by mouth daily. OTC Herbal Detox (Patient not taking: Reported on 04/13/2022)   rifaximin (XIFAXAN) 550 MG TABS tablet Take 1 tablet (550 mg total) by mouth 2 (two) times daily. (Patient not taking: Reported on 04/13/2022)   No facility-administered encounter medications on file as of 04/13/2022.    Follow-up: Return in about 4 months (around 08/12/2022) for chirosis.   Renee Rival, FNP

## 2022-04-13 NOTE — Assessment & Plan Note (Signed)
Wt Readings from Last 3 Encounters:  04/13/22 209 lb 9.6 oz (95.1 kg)  03/02/22 229 lb 4.5 oz (104 kg)  11/24/19 220 lb (99.8 kg)  Would like to resume going back to the gym Patient encouraged to gradually return back to his exercise regimen Engage in regular moderate exercises at least 150 minutes weekly Counseled on low-carb diet ,importance of portion control also discussed

## 2022-04-13 NOTE — Assessment & Plan Note (Addendum)
Secondary to liver cirrhosis, GI bleed now resolved status post banding 7 units of RBC was given in the hospital Patient stated that he had labs done at the GI office so no labs  done today in the office He denies hematemesis, melena Continue Protonix 40 mg daily Patient encouraged to avoid use of NSAIDs, aspirin, avoid use of OTC products containing ginseng, tumeric due to risk of bleeding.  Patient encouraged to continue to abstain from alcohol Lab Results  Component Value Date   WBC 7.6 03/05/2022   HGB 9.5 (L) 03/05/2022   HCT 27.6 (L) 03/05/2022   MCV 90.8 03/05/2022   PLT 95 (L) 03/05/2022

## 2022-04-13 NOTE — Patient Instructions (Addendum)
Please consider getting your flu vaccine and shingles vaccine at the pharmacy    It is important that you exercise regularly at least 30 minutes 5 times a week as tolerated  Think about what you will eat, plan ahead. Choose " clean, green, fresh or frozen" over canned, processed or packaged foods which are more sugary, salty and fatty. 70 to 75% of food eaten should be vegetables and fruit. Three meals at set times with snacks allowed between meals, but they must be fruit or vegetables. Aim to eat over a 12 hour period , example 7 am to 7 pm, and STOP after  your last meal of the day. Drink water,generally about 64 ounces per day, no other drink is as healthy. Fruit juice is best enjoyed in a healthy way, by EATING the fruit.  Thanks for choosing [name] we consider it a privelige to serve you.

## 2022-05-05 ENCOUNTER — Other Ambulatory Visit (HOSPITAL_COMMUNITY): Payer: Self-pay | Admitting: Student

## 2022-05-05 DIAGNOSIS — K922 Gastrointestinal hemorrhage, unspecified: Secondary | ICD-10-CM

## 2022-05-12 ENCOUNTER — Other Ambulatory Visit: Payer: Self-pay | Admitting: *Deleted

## 2022-05-12 DIAGNOSIS — K7469 Other cirrhosis of liver: Secondary | ICD-10-CM

## 2022-06-03 ENCOUNTER — Ambulatory Visit
Admission: RE | Admit: 2022-06-03 | Discharge: 2022-06-03 | Disposition: A | Discharge status: Home / Self Care | Payer: No Typology Code available for payment source | Source: Ambulatory Visit | Attending: Student | Admitting: Student

## 2022-06-03 DIAGNOSIS — K922 Gastrointestinal hemorrhage, unspecified: Secondary | ICD-10-CM

## 2022-06-03 NOTE — Progress Notes (Signed)
Patient ID: Daniel Harmon, male   DOB: [birth year], 52 y.o.   MRN: [id number]        Chief Complaint: Post TIPS and variceal embolization (03/02/2022  Referring Physician(s): Matthews,Kacie Sue-Ellen  History of Present Illness: Daniel Harmon is a 52 y.o. 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  He was taken for EGD by Dr. Lorenso Quarry where large esophageal varices were noted and banded however experienced a large postprocedural bloody bowel movement with associated syncopal episode and hypotension requiring blood transfusion and pressure support.  Ultimately, he was transferred to [facility] and underwent creation of a TIPS and variceal embolization by myself and interventional radiology partner, Dr. Elby Showers, on 03/02/2022. The patient recovered well from this procedure with no additional episodes of bleeding or hemodynamic instability and was discharged on 03/05/2022.  The patient is seen today in the interventional radiology clinic following acquisition of postprocedural hepatic Doppler ultrasound.  He is accompanied by his wife as well as a Passenger transport manager.  The patient was previously taking Xifaxan however stopped this medication as he has not experienced any change in mentation since the TIPS creation.  He reports a recent episode of a bloody nose however denies any recurrent hematemesis or bloody or melanotic stools.  As above, he reports no change in his mentation.  No increased abdominal girth.  No yellowing of the skin or eyes.  The patient's wife remains very involved with his care making sure he eats a well-balanced and healthy diet as well as exercises on a regular basis.  As above, the patient remains abstinence of any alcohol intake.   Past Medical History:  Diagnosis Date   Cirrhosis [facility])     Past Surgical History:  Procedure Laterality Date    ESOPHAGEAL BANDING  02/27/2022   Procedure: ESOPHAGEAL BANDING;  Surgeon: Lynann Bologna, DO;  Location: [facility] ENDOSCOPY;  Service: Gastroenterology;;   ESOPHAGOGASTRODUODENOSCOPY N/A 02/27/2022   Procedure: ESOPHAGOGASTRODUODENOSCOPY (EGD);  Surgeon: Lynann Bologna, DO;  Location: [facility];  Service: Gastroenterology;  Laterality: N/A;   IR EMBO VENOUS NOT HEMORR HEMANG  INC GUIDE ROADMAPPING  03/02/2022   IR IVUS EACH ADDITIONAL NON CORONARY VESSEL  03/02/2022   IR TIPS  03/02/2022   IR US GUIDE VASC ACCESS RIGHT  03/02/2022   IR US GUIDE VASC ACCESS RIGHT  03/03/2022   NO PAST SURGERIES     RADIOLOGY WITH ANESTHESIA N/A 03/02/2022   Procedure: TIPS;  Surgeon: Simonne Come, MD;  Location: [facility] OR;  Service: Radiology;  Laterality: N/A;    Allergies: Patient has no known allergies.  Medications: Prior to Admission medications   Medication Sig Start Date End Date Taking? Authorizing Provider  Cyanocobalamin (B-12 PO) Take 1 tablet by mouth daily. Patient not taking: Reported on 04/13/2022    [provider]  lactulose (CHRONULAC) 10 GM/15ML solution Take 15 mLs (10 g total) by mouth 2 (two) times daily as needed for mild constipation. 03/04/22   Elgergawy, [name], MD  MELATONIN PO Take 1 tablet by mouth at bedtime as needed (sleep).    [provider]  OVER THE COUNTER MEDICATION Take 1 capsule by mouth daily. OTC Herbal Detox Patient not taking: Reported on 04/13/2022    [provider]  pantoprazole (PROTONIX) 40 MG tablet Take 1 tablet (40 mg total) by mouth daily. 03/04/22 04/13/22  Elgergawy, [name], MD  rifaximin [facility])  550 MG TABS tablet Take 1 tablet (550 mg total) by mouth 2 (two) times daily. Patient not taking: Reported on 04/13/2022 03/04/22   Elgergawy, [name], MD  TURMERIC CURCUMIN PO Take 900 mg by mouth.    [provider]     Family History  Problem Relation Age of Onset   Alcoholism Father    Colon cancer Neg Hx     Prostate cancer Neg Hx     Social History   Socioeconomic History   Marital status: Married    Spouse name: Not on file   Number of children: 3   Years of education: Not on file   Highest education level: Not on file  Occupational History   Not on file  Tobacco Use   Smoking status: Former    Types: Cigarettes    Quit date: 07/29/2016    Years since quitting: 5.8   Smokeless tobacco: Never  Vaping Use   Vaping Use: Never used  Substance and Sexual Activity   Alcohol use: Not Currently    Alcohol/week: 14.0 standard drinks of alcohol    Types: 14 Cans of beer per week    Comment: last alcohol was 3 years ago   Drug use: No   Sexual activity: Not on file  Other Topics Concern   Not on file  Social History Narrative   Lives with his wife.    Social Determinants of Health   Financial Resource Strain: Not on file  Food Insecurity: No Food Insecurity (02/27/2022)   Hunger Vital Sign    Worried About Running Out of Food in the Last Year: Never true    Ran Out of Food in the Last Year: Never true  Transportation Needs: No Transportation Needs (02/27/2022)   PRAPARE - Administrator, Civil Service (Medical): No    Lack of Transportation (Non-Medical): No  Physical Activity: Not on file  Stress: Not on file  Social Connections: Not on file    ECOG Status: 0 - Asymptomatic  Review of Systems: A 12 point ROS discussed and pertinent positives are indicated in the HPI above.  All other systems are negative.  Review of Systems  Vital Signs: BP 126/71 (BP Location: Left Arm, Patient Position: Sitting, Cuff Size: Normal)   Pulse 72   Temp 98.5 F (36.9 C) (Oral)   Wt 94.3 kg   SpO2 97% Comment: room air  BMI 33.43 kg/m     Physical Exam  Mallampati Score:     Imaging: US ABDOMINAL PELVIC ART/VENT FLOW DOPPLER  Result Date: 06/03/2022 CLINICAL DATA:  History of cirrhosis, post urgent TIPS creation secondary to bleeding esophageal varices on  02/27/2022. EXAM: DUPLEX ULTRASOUND OF LIVER AND TIPS SHUNT TECHNIQUE: Color and duplex Doppler ultrasound was performed to evaluate the hepatic in-flow and out-flow vessels. COMPARISON:  Image guided TIPS creation - 03/02/2022 CT abdomen and pelvis - 02/27/2022 FINDINGS: There is diffuse coarsened echogenicity of the hepatic parenchyma with nodularity of the hepatic contour compatible with known hepatic cirrhosis. There is a punctate (approximately 1.4 x 1.4 x 0.2 cm anechoic lesion with the dome of the right lobe of the liver which contains a thin internal echogenic septation though with increased through transmission, findings compatible with a minimally complex hepatic cyst demonstrated on abdominal CT performed 02/17/2022. No discrete worrisome hepatic lesions. Normal sonographic appearance of the gallbladder given degree of distention. Common bile duct: Normal in size measuring 0.3 cm in diameter. Portal Vein Velocities Main:  37  cm/sec Right:  90 cm/sec Left:  18 cm/sec TIPS Stent Velocities Proximal:  90 cm/sec Mid:  106 Distal:  86 cm/sec IVC: Present and patent with normal respiratory phasicity. Hepatic Vein Velocities Right:  86 cm/sec Mid:  57 cm/sec Left:  55 cm/sec Splenic Vein: 79 centimeters/seconds Superior Mesenteric Vein: 148 centimeters/second Hepatic Artery: 100 centimeters/seconds Ascites: None visualized Varices: None visualized IMPRESSION: 1. Widely patent TIPS. Acquired velocities will serve as a baseline for future examinations. 2. Findings compatible with known hepatic cirrhosis.  No ascites. 3. Approximately 1.4 cm minimally complex cyst within the dome of the right lobe of the liver, unchanged compared to abdominal CT performed 02/17/2022. No discrete worrisome hepatic lesion to suggest the presence of hepatocellular carcinoma. Further evaluation with abdominal MRI could be performed as indicated. Electronically Signed   By: Sandi Mariscal M.D.   On: 06/03/2022 10:06     Labs:  CBC: Recent Labs    03/02/22 0556 03/02/22 1413 03/02/22 2204 03/03/22 0117 03/04/22 0835 03/05/22 0431  WBC 5.3  --   --  9.3 10.0 7.6  HGB 8.5*   < > 9.8* 9.8* 10.2* 9.5*  HCT 23.2*   < > 28.0* 27.1* 28.6* 27.6*  PLT 59*  --   --  101* 105* 95*   < > = values in this interval not displayed.    COAGS: Recent Labs    02/27/22 0757 02/27/22 1856 02/28/22 0403 03/02/22 0818  INR 1.7* 1.7* 1.5* 1.5*  APTT 30 24  --   --     BMP: Recent Labs    03/01/22 0340 03/02/22 0556 03/02/22 1413 03/03/22 0117 03/05/22 0431  NA 143 138 138 135 135  K 3.6 3.9 3.7 4.0 3.6  CL 113* 108  --  103 106  CO2 26 24  --  21* 20*  GLUCOSE 115* 99  --  142* 97  BUN 20 11  --  9 12  CALCIUM 7.3* 7.6*  --  7.5* 7.8*  CREATININE 0.98 0.90  --  0.94 1.00  GFRNONAA >60 >60  --  >60 >60    LIVER FUNCTION TESTS: Recent Labs    02/27/22 0445 02/27/22 1658 03/02/22 0556 03/03/22 0117 03/05/22 0431  BILITOT 1.6* 1.2 1.1 1.3* 1.6*  AST 34  --  59* 62* 107*  ALT 33  --  49* 65* 126*  ALKPHOS 66  --  39 52 82  PROT 6.3*  --  3.9* 4.7* 4.7*  ALBUMIN 3.5  --  2.2* 2.5* 2.5*    TUMOR MARKERS: No results for input(s): "AFPTM", "CEA", "CA199", "CHROMGRNA" in the last 8760 hours.  Assessment and Plan:  Daniel Harmon is a 52 y.o. male with history of prior heavy alcohol use (sober for the past several years) who was admitted to [facility] long hospital on 02/27/2022 with hematemesis, ultimately transferred to [facility] where he underwent creation of a TIPS and variceal embolization by myself and interventional radiology partner, Dr. Serafina Royals, on 03/02/2022.   Hepatic TIPS Doppler ultrasound performed earlier today demonstrates wide patency of the TIPS with normal directional flow.  No ascites.  Updated laboratory values are not available at the time of this consultation.  The patient and the patient's wife were educated that any episodes of confusion or altered mental status  could herald development of hepatic encephalopathy in which case he would have to restart either Xifaxan or lactulose however if he is not currently symptomatic these medicines are not required.  The Patient  was encouraged to maintain his follow-up appointment with Dr. Alessandra Bevels scheduled in coming weeks.  Repeat endoscopy may be performed at the discretion of Dr. Alessandra Bevels however is reasonable given the patient's clinically significant upper GI bleed.   I explained to the patient and the patient's wife that as he has cirrhosis he is at risk for development of hepatocellular carcinoma however presently there is no evidence that this has occurred. I will defer hepatocellular carcinoma screening to Dr. Alessandra Bevels thought I do think it is reasonable to consider surveillance imaging with abdominal MRI given advanced changes of cirrhosis seen both on today's hepatic Doppler ultrasound as well as CT of the abdomen pelvis performed 02/27/2022.    Continued abstinence from alcohol was stressed.    Continued maintenance of a good diet and healthy lifestyle including exercise was encouraged.  The patient and the patient's wife demonstrated excellent understanding of the above conversation and are very appreciative of the care they received while admitted both to [facility] and [facility].  Plan: - Follow-up consultation at the interventional radiology clinic in 6 months (July 2024) following acquisition of hepatic Doppler ultrasound. - The patient was encouraged to maintain follow-up appointments with Dr. Alessandra Bevels for consideration of repeat endoscopy and for consideration of the initiation of hepatocellular carcinoma screening.  Thank you for this interesting consult.  I greatly enjoyed meeting [place] and look forward to participating in their care.  A copy of this report was sent to the requesting provider on this date.  Electronically Signed: Sandi Mariscal 06/03/2022, 12:23 PM   I spent a  total of 25 Minutes in face to face in clinical consultation, greater than 50% of which was counseling/coordinating care for post TIPS

## 2022-06-07 ENCOUNTER — Ambulatory Visit (INDEPENDENT_AMBULATORY_CARE_PROVIDER_SITE_OTHER): Payer: Self-pay | Admitting: Nurse Practitioner

## 2022-06-07 VITALS — BP 125/52 | HR 74 | Temp 98.0°F | Ht 65.75 in | Wt 206.0 lb

## 2022-06-07 DIAGNOSIS — D696 Thrombocytopenia, unspecified: Secondary | ICD-10-CM

## 2022-06-07 DIAGNOSIS — R040 Epistaxis: Secondary | ICD-10-CM | POA: Insufficient documentation

## 2022-06-07 DIAGNOSIS — H04129 Dry eye syndrome of unspecified lacrimal gland: Secondary | ICD-10-CM

## 2022-06-07 MED ORDER — OXYMETAZOLINE HCL 0.05 % NA SOLN: NASAL | 0 refills | Status: AC

## 2022-06-07 NOTE — Assessment & Plan Note (Addendum)
Most recent platelet count was 87 in November 2023 Has history of liver cirrhoses.  Will consider referral to hematology at next visit if platelet count drops further. Avoid NSAIDS , alcohol.

## 2022-06-07 NOTE — Assessment & Plan Note (Addendum)
Has had episodes of nasal bleeding this week, bleeding not hard to control, his history of alcohol cirrhosis, low platelet count can be contributing to this.  He reported that he recently had labs done by GI so no labs today. Most recent plt count is 87.  - oxymetazoline (AFRIN) 0.05 % nasal spray; 2 to 6 sprays in the affected nostril during the active bleed, then 2 sprays 3 to 4 times daily for 3 to 5 days.  Dispense: 30 mL; Refill: 0 - Ambulatory referral to ENT Instructions on how to control bleeding dicussed today . Patient told to avoid taking tumeric, alcohol

## 2022-06-07 NOTE — Assessment & Plan Note (Addendum)
Patient encouraged to get over-the-counter Systane eyedrops and use as instructed. Follow up with his opthamologist as needed.

## 2022-06-07 NOTE — Progress Notes (Signed)
Acute Office Visit  Subjective:     Patient ID: Daniel Harmon, male    DOB: 06-27-1970, 52 y.o.   MRN: [id number]  Chief Complaint  Patient presents with   Epistaxis    Epistaxis    Daniel Harmon with past medical history of alcoholic cirrhosis of the liver, upper GI bleed, alcoholism, obesity presents with complaints of intermittent right nasal bleeding, stated that his nasal bleeding usually resolves within 45 seconds to 60 seconds, reports that the bleeding is minimal.  He recently had symptoms of cold and  cough symptoms when had the nasal bleed. Has had 2 episodes of nasal bleeding this week . He has been following up with GI regularly , has an upcoming appointment in February to discuss  when he will be able to get a colonoscopy.  Patient denies abdominal pain, nausea, vomiting, hematemesis, melena, dizziness, headaches.   Patient complains of bilateral dry eye for the past 3 weeks, last eye exam was 2 years ago, wears glasses.  Patient denies itchy eyes, eye pain, discharge, redness.   Review of Systems  Constitutional:  Negative for chills, diaphoresis, fever, malaise/fatigue and weight loss.  HENT:  Positive for nosebleeds. Negative for ear discharge, ear pain, hearing loss, sinus pain, sore throat and tinnitus.   Eyes:  Negative for photophobia, pain, discharge and redness.  Respiratory: Negative.  Negative for cough, hemoptysis, sputum production, shortness of breath, wheezing and stridor.   Cardiovascular: Negative.  Negative for chest pain, palpitations, orthopnea, claudication, leg swelling and PND.  Gastrointestinal:  Negative for abdominal pain, blood in stool, constipation, diarrhea, melena and vomiting.  Musculoskeletal: Negative.   Neurological: Negative.  Negative for dizziness, tingling, tremors, sensory change and headaches.  Psychiatric/Behavioral: Negative.  Negative for depression, hallucinations, substance abuse and suicidal ideas.         Objective:    BP  (!) 125/52   Pulse 74   Temp 98 F (36.7 C)   Ht 5' 5.75" (1.67 m)   Wt 206 lb (93.4 kg)   SpO2 99%   BMI 33.50 kg/m    Physical Exam Constitutional:      General: He is not in acute distress.    Appearance: Normal appearance. He is not ill-appearing, toxic-appearing or diaphoretic.  HENT:     Right Ear: Tympanic membrane, ear canal and external ear normal. There is no impacted cerumen.     Left Ear: Tympanic membrane, ear canal and external ear normal. There is no impacted cerumen.     Nose: Nose normal. No congestion or rhinorrhea.     Mouth/Throat:     Mouth: Mucous membranes are moist.     Pharynx: Oropharynx is clear. No oropharyngeal exudate or posterior oropharyngeal erythema.  Eyes:     General: No scleral icterus.       Right eye: No discharge.        Left eye: No discharge.     Conjunctiva/sclera: Conjunctivae normal.  Cardiovascular:     Rate and Rhythm: Normal rate and regular rhythm.     Pulses: Normal pulses.     Heart sounds: Normal heart sounds. No murmur heard.    No friction rub. No gallop.  Pulmonary:     Effort: Pulmonary effort is normal. No respiratory distress.     Breath sounds: Normal breath sounds. No stridor. No wheezing, rhonchi or rales.  Chest:     Chest wall: No tenderness.  Abdominal:     General: There is no distension.  Palpations: Abdomen is soft.     Tenderness: There is no abdominal tenderness.  Musculoskeletal:        General: No swelling, tenderness, deformity or signs of injury.  Skin:    General: Skin is warm and dry.     Capillary Refill: Capillary refill takes less than 2 seconds.     Coloration: Skin is not jaundiced.     Findings: No bruising or lesion.  Neurological:     Mental Status: He is alert and oriented to person, place, and time.     Cranial Nerves: No cranial nerve deficit.     Sensory: No sensory deficit.     Motor: No weakness.     Coordination: Coordination normal.     Gait: Gait normal.  Psychiatric:         Mood and Affect: Mood normal.        Behavior: Behavior normal.        Thought Content: Thought content normal.        Judgment: Judgment normal.     No results found for any visits on 06/07/22.      Assessment & Plan:   Problem List Items Addressed This Visit       Hematopoietic and Hemostatic   Thrombocytopenia ([name])    Most recent platelet count was 87 in November 2023 Has history of liver cirrhoses.  Will consider referral to hematology at next visit if platelet count drops further.         Other   Dry eye    Patient encouraged to get over-the-counter Systane eyedrops and use as instructed. Follow up with his opthamologist as needed.       Epistaxis - Primary    Has had episodes of nasal bleeding this week, bleeding not hard to control, his history of alcohol cirrhosis, low platelet count can be contributing to this.  He reported that he recently had labs done by GI so no labs today. Most recent plt count is 87.  - oxymetazoline (AFRIN) 0.05 % nasal spray; 2 to 6 sprays in the affected nostril during the active bleed, then 2 sprays 3 to 4 times daily for 3 to 5 days.  Dispense: 30 mL; Refill: 0 - Ambulatory referral to ENT Instructions on how to control bleeding dicussed today . Patient told to avoid taking tumeric, alcohol      Relevant Medications   oxymetazoline (AFRIN) 0.05 % nasal spray   Other Relevant Orders   Ambulatory referral to ENT    Meds ordered this encounter  Medications   oxymetazoline (AFRIN) 0.05 % nasal spray    Sig: 2 to 6 sprays in the affected nostril during the active bleed, then 2 sprays 3 to 4 times daily for 3 to 5 days.    Dispense:  30 mL    Refill:  0    No follow-ups on file.  Renee Rival, FNP

## 2022-06-07 NOTE — Patient Instructions (Addendum)
1. Epistaxis  - oxymetazoline (AFRIN) 0.05 % nasal spray; 2 to 6 sprays in the affected nostril during the active bleed, then 2 sprays 3 to 4 times daily for 3 to 5 days.  Dispense: 30 mL; Refill: 0    Dry eyes. Please buy Over the counter systane eye drops at your pharmacy and use as instructed.   It is important that you exercise regularly at least 30 minutes 5 times a week as tolerated  Think about what you will eat, plan ahead. Choose " clean, green, fresh or frozen" over canned, processed or packaged foods which are more sugary, salty and fatty. 70 to 75% of food eaten should be vegetables and fruit. Three meals at set times with snacks allowed between meals, but they must be fruit or vegetables. Aim to eat over a 12 hour period , example 7 am to 7 pm, and STOP after  your last meal of the day. Drink water,generally about 64 ounces per day, no other drink is as healthy. Fruit juice is best enjoyed in a healthy way, by EATING the fruit.  Thanks for choosing [facility] we consider it a privelige to serve you.

## 2022-08-08 ENCOUNTER — Ambulatory Visit: Payer: Self-pay | Admitting: Nurse Practitioner

## 2022-08-15 ENCOUNTER — Ambulatory Visit: Payer: Self-pay | Admitting: Nurse Practitioner

## 2022-09-30 ENCOUNTER — Other Ambulatory Visit: Payer: Self-pay | Admitting: Gastroenterology

## 2022-09-30 DIAGNOSIS — K7689 Other specified diseases of liver: Secondary | ICD-10-CM

## 2022-11-10 ENCOUNTER — Ambulatory Visit
Admission: RE | Admit: 2022-11-10 | Discharge: 2022-11-10 | Disposition: A | Discharge status: Home / Self Care | Payer: No Typology Code available for payment source | Source: Ambulatory Visit | Attending: Gastroenterology | Admitting: Gastroenterology

## 2022-11-10 DIAGNOSIS — K7689 Other specified diseases of liver: Secondary | ICD-10-CM

## 2022-11-10 MED ORDER — GADOPICLENOL 0.5 MMOL/ML IV SOLN
10.0000 mL | Freq: Once | INTRAVENOUS | Status: AC | PRN
  Administered 2022-11-10: 10 mL via INTRAVENOUS

## 2022-12-16 ENCOUNTER — Other Ambulatory Visit: Payer: Self-pay | Admitting: Interventional Radiology

## 2022-12-16 DIAGNOSIS — Z8719 Personal history of other diseases of the digestive system: Secondary | ICD-10-CM

## 2022-12-16 DIAGNOSIS — K922 Gastrointestinal hemorrhage, unspecified: Secondary | ICD-10-CM

## 2023-01-02 ENCOUNTER — Ambulatory Visit
Admission: RE | Admit: 2023-01-02 | Discharge: 2023-01-02 | Disposition: A | Discharge status: Home / Self Care | Payer: Self-pay | Source: Ambulatory Visit | Attending: Interventional Radiology | Admitting: Interventional Radiology

## 2023-01-02 DIAGNOSIS — K922 Gastrointestinal hemorrhage, unspecified: Secondary | ICD-10-CM

## 2023-01-02 DIAGNOSIS — Z8719 Personal history of other diseases of the digestive system: Secondary | ICD-10-CM

## 2023-01-05 NOTE — Progress Notes (Addendum)
Chief Complaint: The patient is seen for virtual follow up visit today s/p TIPS 03/02/22  Referring Physician(s): Dr. Levora Angel  History of present illness: Daniel Harmon, 52 year old male, has a medical history significant for alcoholic/NASH cirrhosis. He presented to the ED 02/27/22 with hematemesis and was found to have large esophageal varices which were banded endoscopically. Interventional Radiology was consulted for portal hypertension evaluation and on 03/02/22 he underwent TIPS with coil embolization of left gastric and posterior gastric veins. He was discharged home 03/05/22 and he followed up with our team 06/03/22 for a post-TIPS visit. The patient and his wife met with Dr. Grace Isaac and the patient reported doing well at that time. He was abstaining from alcohol, eating well and exercising regularly. He had previously taken Xifaxan but had stopped. Dr. Grace Isaac educated the patient and his wife on the signs/symptoms of hepatic encephalopathy that would require restarting either Xifaxan or lactulose. He recommended the patient follow up with Dr. Levora Angel for consideration of repeat endoscopy and possibly initiation of hepatocellular carcinoma screening.   The patient presents today via virtual tele-health visit for TIPS follow up and to discuss his recent hepatic doppler ultrasound. He is overall doing very well.  He has had no hematemesis or blood in his stool. He has a healthy diet and good appetite.  He denies any encephalopathy, and has not taken rifaximin or lactulose in about 5 months.  He goes to the gym 3-4 days per week.  No abdominal or lower extremity swelling.    Past Medical History:  Diagnosis Date   Cirrhosis [facility])     Past Surgical History:  Procedure Laterality Date   ESOPHAGEAL BANDING  02/27/2022   Procedure: ESOPHAGEAL BANDING;  Surgeon: Lynann Bologna, DO;  Location: [facility] ENDOSCOPY;  Service: Gastroenterology;;   ESOPHAGOGASTRODUODENOSCOPY N/A 02/27/2022    Procedure: ESOPHAGOGASTRODUODENOSCOPY (EGD);  Surgeon: Lynann Bologna, DO;  Location: [facility];  Service: Gastroenterology;  Laterality: N/A;   IR EMBO VENOUS NOT HEMORR HEMANG  INC GUIDE ROADMAPPING  03/02/2022   IR IVUS EACH ADDITIONAL NON CORONARY VESSEL  03/02/2022   IR TIPS  03/02/2022   IR US GUIDE VASC ACCESS RIGHT  03/02/2022   IR US GUIDE VASC ACCESS RIGHT  03/03/2022   NO PAST SURGERIES     RADIOLOGY WITH ANESTHESIA N/A 03/02/2022   Procedure: TIPS;  Surgeon: Simonne Come, MD;  Location: [facility] OR;  Service: Radiology;  Laterality: N/A;    Allergies: Patient has no known allergies.  Medications: Prior to Admission medications   Medication Sig Start Date End Date Taking? Authorizing Provider  Cyanocobalamin (B-12 PO) Take 1 tablet by mouth daily. Patient not taking: Reported on 04/13/2022    [provider]  lactulose (CHRONULAC) 10 GM/15ML solution Take 15 mLs (10 g total) by mouth 2 (two) times daily as needed for mild constipation. Patient not taking: Reported on 06/07/2022 03/04/22   Elgergawy, [name], MD  MELATONIN PO Take 1 tablet by mouth at bedtime as needed (sleep).    [provider]  OVER THE COUNTER MEDICATION Take 1 capsule by mouth daily. OTC Herbal Detox Patient not taking: Reported on 04/13/2022    [provider]  oxymetazoline (AFRIN) 0.05 % nasal spray 2 to 6 sprays in the affected nostril during the active bleed, then 2 sprays 3 to 4 times daily for 3 to 5 days. 06/07/22   Paseda, Baird Kay, FNP  pantoprazole (PROTONIX) 40 MG tablet Take 1 tablet (40 mg total) by mouth daily.  03/04/22 06/07/22  Elgergawy, [name], MD  rifaximin (XIFAXAN) 550 MG TABS tablet Take 1 tablet (550 mg total) by mouth 2 (two) times daily. Patient not taking: Reported on 04/13/2022 03/04/22   Elgergawy, [name], MD  TURMERIC CURCUMIN PO Take 900 mg by mouth. Patient not taking: Reported on 06/07/2022    [provider]  Vitamin D,  Cholecalciferol, 25 MCG (1000 UT) TABS Take by mouth.    [provider]     Family History  Problem Relation Age of Onset   Alcoholism Father    Colon cancer Neg Hx    Prostate cancer Neg Hx     Social History   Socioeconomic History   Marital status: Married    Spouse name: Not on file   Number of children: 3   Years of education: Not on file   Highest education level: Not on file  Occupational History   Not on file  Tobacco Use   Smoking status: Former    Current packs/day: 0.00    Types: Cigarettes    Quit date: 07/29/2016    Years since quitting: 6.4   Smokeless tobacco: Never  Vaping Use   Vaping status: Never Used  Substance and Sexual Activity   Alcohol use: Not Currently    Alcohol/week: 14.0 standard drinks of alcohol    Types: 14 Cans of beer per week    Comment: last alcohol was 3 years ago   Drug use: No   Sexual activity: Not on file  Other Topics Concern   Not on file  Social History Narrative   Lives with his wife.    Social Determinants of Health   Financial Resource Strain: Not on file  Food Insecurity: No Food Insecurity (02/27/2022)   Hunger Vital Sign    Worried About Running Out of Food in the Last Year: Never true    Ran Out of Food in the Last Year: Never true  Transportation Needs: No Transportation Needs (02/27/2022)   PRAPARE - Administrator, Civil Service (Medical): No    Lack of Transportation (Non-Medical): No  Physical Activity: Not on file  Stress: Not on file  Social Connections: Not on file     Vital Signs: There were no vitals taken for this visit.  No physical exam was performed in lieu of virtual visit.   Imaging: IR TIPS 03/02/22  7+2 cm Viatorr, dilated to 8 mm, portosystemic gradient from 15 to 6.  MRI Abdomen 11/10/22 IMPRESSION: 1. Cirrhosis with sequela of portal hypertension. No suspicious hepatic lesion. 2. 13 mm cyst in the dome of the liver does not demonstrate suspicious  features.   [place] TIPS 01/02/23 IMPRESSION: Patent indwelling TIPS endograft and visualized portal veins.   Labs: No new, pertinent labs available.   Assessment and Plan: 52 year old male with a history of alcoholic/NASH cirrhosis with portal hypertension and esophageal varices. He underwent TIPS with coil embolization of left gastric and posterior gastric veins on 03/02/22.  Recent TIPS duplex demonstrates patency of the indwelling endograft.  He is overall doing very well without any evidence of bleeding or encephalopathy, off all HE prophylatic medications.  -consider follow up EGD as per Dr. Levora Angel -follow up in 6 months in IR clinic, no additional imaging needed   Marliss Coots, MD Pager: [phone] Clinic: [phone]    I spent a total of 25 Minutes in virtual clinical consultation, greater than 50% of which was counseling/coordinating care for s/p TIPS

## 2023-01-11 ENCOUNTER — Ambulatory Visit
Admission: RE | Admit: 2023-01-11 | Discharge: 2023-01-11 | Disposition: A | Discharge status: Home / Self Care | Payer: Self-pay | Source: Ambulatory Visit | Attending: Interventional Radiology | Admitting: Interventional Radiology

## 2023-01-11 DIAGNOSIS — Z8719 Personal history of other diseases of the digestive system: Secondary | ICD-10-CM

## 2023-01-11 DIAGNOSIS — K922 Gastrointestinal hemorrhage, unspecified: Secondary | ICD-10-CM

## 2023-01-11 HISTORY — PX: IR RADIOLOGIST EVAL & MGMT: IMG5224

## 2023-04-03 LAB — HM COLONOSCOPY

## 2023-07-03 ENCOUNTER — Other Ambulatory Visit: Payer: Self-pay | Admitting: Gastroenterology

## 2023-07-03 DIAGNOSIS — K7460 Unspecified cirrhosis of liver: Secondary | ICD-10-CM

## 2023-07-10 ENCOUNTER — Ambulatory Visit
Admission: RE | Admit: 2023-07-10 | Discharge: 2023-07-10 | Disposition: A | Discharge status: Home / Self Care | Payer: No Typology Code available for payment source | Source: Ambulatory Visit | Attending: Gastroenterology | Admitting: Gastroenterology

## 2023-07-10 DIAGNOSIS — K7460 Unspecified cirrhosis of liver: Secondary | ICD-10-CM

## 2023-12-25 ENCOUNTER — Other Ambulatory Visit: Payer: Self-pay | Admitting: Gastroenterology

## 2023-12-25 DIAGNOSIS — K7460 Unspecified cirrhosis of liver: Secondary | ICD-10-CM

## 2023-12-27 ENCOUNTER — Other Ambulatory Visit

## 2024-01-01 ENCOUNTER — Ambulatory Visit
Admission: RE | Admit: 2024-01-01 | Discharge: 2024-01-01 | Disposition: A | Discharge status: Home / Self Care | Source: Ambulatory Visit | Attending: Gastroenterology | Admitting: Gastroenterology

## 2024-01-01 DIAGNOSIS — K7460 Unspecified cirrhosis of liver: Secondary | ICD-10-CM
